# Patient Record
Sex: Male | Born: 1960 | Race: Black or African American | Hispanic: No | Marital: Married | Smoking: Never smoker
Health system: Southern US, Community
[De-identification: ages and names within clinical notes are randomized; demographics above are authoritative.]

## PROBLEM LIST (undated history)

## (undated) DIAGNOSIS — E785 Hyperlipidemia, unspecified: Secondary | ICD-10-CM

## (undated) DIAGNOSIS — M109 Gout, unspecified: Secondary | ICD-10-CM

## (undated) DIAGNOSIS — I10 Essential (primary) hypertension: Secondary | ICD-10-CM

## (undated) HISTORY — DX: Hyperlipidemia, unspecified: E78.5

---

## 2001-04-15 ENCOUNTER — Encounter: Payer: Self-pay | Admitting: Emergency Medicine

## 2001-04-15 ENCOUNTER — Emergency Department (HOSPITAL_COMMUNITY): Admission: EM | Admit: 2001-04-15 | Discharge: 2001-04-15 | Payer: Self-pay | Admitting: Emergency Medicine

## 2001-04-16 ENCOUNTER — Emergency Department (HOSPITAL_COMMUNITY): Admission: EM | Admit: 2001-04-16 | Discharge: 2001-04-16 | Payer: Self-pay | Admitting: Emergency Medicine

## 2003-06-24 ENCOUNTER — Emergency Department (HOSPITAL_COMMUNITY): Admission: EM | Admit: 2003-06-24 | Discharge: 2003-06-24 | Payer: Self-pay

## 2004-02-14 ENCOUNTER — Emergency Department (HOSPITAL_COMMUNITY): Admission: EM | Admit: 2004-02-14 | Discharge: 2004-02-14 | Payer: Self-pay | Admitting: Family Medicine

## 2004-05-05 ENCOUNTER — Emergency Department (HOSPITAL_COMMUNITY): Admission: EM | Admit: 2004-05-05 | Discharge: 2004-05-05 | Payer: Self-pay | Admitting: Emergency Medicine

## 2008-06-09 ENCOUNTER — Encounter: Payer: Self-pay | Admitting: Internal Medicine

## 2008-06-09 ENCOUNTER — Ambulatory Visit: Payer: Self-pay | Admitting: Internal Medicine

## 2008-06-09 DIAGNOSIS — M79609 Pain in unspecified limb: Secondary | ICD-10-CM

## 2008-06-09 DIAGNOSIS — I10 Essential (primary) hypertension: Secondary | ICD-10-CM | POA: Insufficient documentation

## 2008-06-09 DIAGNOSIS — L309 Dermatitis, unspecified: Secondary | ICD-10-CM

## 2008-06-09 DIAGNOSIS — L239 Allergic contact dermatitis, unspecified cause: Secondary | ICD-10-CM | POA: Insufficient documentation

## 2008-06-09 DIAGNOSIS — F528 Other sexual dysfunction not due to a substance or known physiological condition: Secondary | ICD-10-CM

## 2008-06-11 DIAGNOSIS — E291 Testicular hypofunction: Secondary | ICD-10-CM

## 2008-06-11 LAB — CONVERTED CEMR LAB
ALT: 23 units/L (ref 0–53)
AST: 20 units/L (ref 0–37)
Albumin: 4.3 g/dL (ref 3.5–5.2)
Alkaline Phosphatase: 56 units/L (ref 39–117)
BUN: 25 mg/dL — ABNORMAL HIGH (ref 6–23)
Basophils Absolute: 0 10*3/uL (ref 0.0–0.1)
Basophils Relative: 0 % (ref 0–1)
CO2: 23 meq/L (ref 19–32)
Calcium: 9.2 mg/dL (ref 8.4–10.5)
Chloride: 104 meq/L (ref 96–112)
Creatinine, Ser: 1.15 mg/dL (ref 0.40–1.50)
Eosinophils Absolute: 0.3 10*3/uL (ref 0.0–0.7)
Eosinophils Relative: 5 % (ref 0–5)
Glucose, Bld: 106 mg/dL — ABNORMAL HIGH (ref 70–99)
HCT: 41.6 % (ref 39.0–52.0)
Hemoglobin: 13.5 g/dL (ref 13.0–17.0)
Lymphocytes Relative: 37 % (ref 12–46)
Lymphs Abs: 2.1 10*3/uL (ref 0.7–4.0)
MCHC: 32.5 g/dL (ref 30.0–36.0)
MCV: 84.6 fL (ref 78.0–100.0)
Monocytes Absolute: 0.5 10*3/uL (ref 0.1–1.0)
Monocytes Relative: 9 % (ref 3–12)
Neutro Abs: 2.8 10*3/uL (ref 1.7–7.7)
Neutrophils Relative %: 48 % (ref 43–77)
PSA: 1.04 ng/mL (ref 0.10–4.00)
Platelets: 210 10*3/uL (ref 150–400)
Potassium: 4.3 meq/L (ref 3.5–5.3)
RBC: 4.92 M/uL (ref 4.22–5.81)
RDW: 13.7 % (ref 11.5–15.5)
Sodium: 138 meq/L (ref 135–145)
TSH: 0.935 microintl units/mL (ref 0.350–4.50)
Testosterone: 220.43 ng/dL — ABNORMAL LOW (ref 350–890)
Total Bilirubin: 1 mg/dL (ref 0.3–1.2)
Total Protein: 7.5 g/dL (ref 6.0–8.3)
WBC: 5.7 10*3/uL (ref 4.0–10.5)

## 2008-06-16 ENCOUNTER — Telehealth: Payer: Self-pay | Admitting: Internal Medicine

## 2008-06-16 ENCOUNTER — Encounter: Payer: Self-pay | Admitting: Internal Medicine

## 2008-07-07 ENCOUNTER — Telehealth: Payer: Self-pay | Admitting: Internal Medicine

## 2008-08-08 ENCOUNTER — Telehealth: Payer: Self-pay | Admitting: Internal Medicine

## 2008-08-10 ENCOUNTER — Telehealth: Payer: Self-pay | Admitting: *Deleted

## 2008-12-29 ENCOUNTER — Telehealth: Payer: Self-pay | Admitting: Internal Medicine

## 2009-01-02 ENCOUNTER — Ambulatory Visit: Payer: Self-pay | Admitting: Infectious Disease

## 2009-01-02 LAB — CONVERTED CEMR LAB
BUN: 20 mg/dL (ref 6–23)
CO2: 22 meq/L (ref 19–32)
Calcium: 9.1 mg/dL (ref 8.4–10.5)
Chloride: 105 meq/L (ref 96–112)
Cholesterol: 215 mg/dL — ABNORMAL HIGH (ref 0–200)
Creatinine, Ser: 1.01 mg/dL (ref 0.40–1.50)
Glucose, Bld: 94 mg/dL (ref 70–99)
HDL: 54 mg/dL (ref >39)
LDL Cholesterol: 98 mg/dL (ref 0–99)
Potassium: 4.4 meq/L (ref 3.5–5.3)
Sodium: 140 meq/L (ref 135–145)
Total CHOL/HDL Ratio: 4
Triglycerides: 316 mg/dL — ABNORMAL HIGH (ref <150)
VLDL: 63 mg/dL — ABNORMAL HIGH (ref 0–40)

## 2010-07-30 ENCOUNTER — Emergency Department (HOSPITAL_COMMUNITY)
Admission: EM | Admit: 2010-07-30 | Discharge: 2010-07-30 | Disposition: A | Discharge status: Home / Self Care | Payer: Self-pay | Attending: Emergency Medicine | Admitting: Emergency Medicine

## 2010-07-30 DIAGNOSIS — M25429 Effusion, unspecified elbow: Secondary | ICD-10-CM | POA: Insufficient documentation

## 2010-07-30 DIAGNOSIS — M25529 Pain in unspecified elbow: Secondary | ICD-10-CM | POA: Insufficient documentation

## 2010-07-30 DIAGNOSIS — M109 Gout, unspecified: Secondary | ICD-10-CM | POA: Insufficient documentation

## 2010-07-30 LAB — SYNOVIAL CELL COUNT + DIFF, W/ CRYSTALS
Lymphocytes-Synovial Fld: 1 % (ref 0–20)
Monocyte-Macrophage-Synovial Fluid: 5 % — ABNORMAL LOW (ref 50–90)
Neutrophil, Synovial: 94 % — ABNORMAL HIGH (ref 0–25)
WBC, Synovial: 82000 /mm3 — ABNORMAL HIGH (ref 0–200)

## 2010-08-03 LAB — BODY FLUID CULTURE: Culture: NO GROWTH

## 2010-10-02 ENCOUNTER — Emergency Department (HOSPITAL_COMMUNITY): Payer: No Typology Code available for payment source

## 2010-10-02 ENCOUNTER — Emergency Department (HOSPITAL_COMMUNITY)
Admission: EM | Admit: 2010-10-02 | Discharge: 2010-10-02 | Disposition: A | Discharge status: Home / Self Care | Payer: No Typology Code available for payment source | Attending: Emergency Medicine | Admitting: Emergency Medicine

## 2010-10-02 ENCOUNTER — Encounter (HOSPITAL_COMMUNITY): Payer: Self-pay | Admitting: Radiology

## 2010-10-02 DIAGNOSIS — R071 Chest pain on breathing: Secondary | ICD-10-CM | POA: Insufficient documentation

## 2010-10-02 DIAGNOSIS — Z862 Personal history of diseases of the blood and blood-forming organs and certain disorders involving the immune mechanism: Secondary | ICD-10-CM | POA: Insufficient documentation

## 2010-10-02 DIAGNOSIS — S298XXA Other specified injuries of thorax, initial encounter: Secondary | ICD-10-CM | POA: Insufficient documentation

## 2010-10-02 DIAGNOSIS — IMO0002 Reserved for concepts with insufficient information to code with codable children: Secondary | ICD-10-CM | POA: Insufficient documentation

## 2010-10-02 DIAGNOSIS — R10811 Right upper quadrant abdominal tenderness: Secondary | ICD-10-CM | POA: Insufficient documentation

## 2010-10-02 DIAGNOSIS — Z8639 Personal history of other endocrine, nutritional and metabolic disease: Secondary | ICD-10-CM | POA: Insufficient documentation

## 2010-10-02 HISTORY — DX: Essential (primary) hypertension: I10

## 2010-10-02 LAB — POCT I-STAT, CHEM 8
Glucose, Bld: 107 mg/dL — ABNORMAL HIGH (ref 70–99)
HCT: 45 % (ref 39.0–52.0)
Hemoglobin: 15.3 g/dL (ref 13.0–17.0)
Potassium: 4.4 mEq/L (ref 3.5–5.1)

## 2010-10-02 MED ORDER — IOHEXOL 300 MG/ML  SOLN
100.0000 mL | Freq: Once | INTRAMUSCULAR | Status: AC | PRN
  Administered 2010-10-02: 100 mL via INTRAVENOUS

## 2010-10-07 ENCOUNTER — Emergency Department (HOSPITAL_COMMUNITY)
Admission: EM | Admit: 2010-10-07 | Discharge: 2010-10-07 | Disposition: A | Discharge status: Home / Self Care | Payer: No Typology Code available for payment source | Attending: Emergency Medicine | Admitting: Emergency Medicine

## 2010-10-07 ENCOUNTER — Emergency Department (HOSPITAL_COMMUNITY): Payer: No Typology Code available for payment source

## 2010-10-07 DIAGNOSIS — R079 Chest pain, unspecified: Secondary | ICD-10-CM | POA: Insufficient documentation

## 2010-10-07 DIAGNOSIS — S2239XA Fracture of one rib, unspecified side, initial encounter for closed fracture: Secondary | ICD-10-CM | POA: Insufficient documentation

## 2010-10-07 DIAGNOSIS — R030 Elevated blood-pressure reading, without diagnosis of hypertension: Secondary | ICD-10-CM | POA: Insufficient documentation

## 2010-10-07 DIAGNOSIS — Z862 Personal history of diseases of the blood and blood-forming organs and certain disorders involving the immune mechanism: Secondary | ICD-10-CM | POA: Insufficient documentation

## 2010-10-07 DIAGNOSIS — Z8639 Personal history of other endocrine, nutritional and metabolic disease: Secondary | ICD-10-CM | POA: Insufficient documentation

## 2010-10-09 ENCOUNTER — Ambulatory Visit (INDEPENDENT_AMBULATORY_CARE_PROVIDER_SITE_OTHER): Payer: No Typology Code available for payment source | Admitting: Internal Medicine

## 2010-10-09 ENCOUNTER — Encounter: Payer: Self-pay | Admitting: Internal Medicine

## 2010-10-09 VITALS — BP 158/92 | HR 56 | Temp 97.0°F | Ht 72.0 in | Wt 218.6 lb

## 2010-10-09 DIAGNOSIS — S2239XA Fracture of one rib, unspecified side, initial encounter for closed fracture: Secondary | ICD-10-CM

## 2010-10-09 DIAGNOSIS — S2249XA Multiple fractures of ribs, unspecified side, initial encounter for closed fracture: Secondary | ICD-10-CM | POA: Insufficient documentation

## 2010-10-09 MED ORDER — OXYCODONE-ACETAMINOPHEN 7.5-500 MG PO TABS: 1.0000 | ORAL_TABLET | Freq: Four times a day (QID) | ORAL | Status: DC | PRN

## 2010-10-09 NOTE — Patient Instructions (Addendum)
Schedule a follow up appointment with your primary care provider for an annual physical. If your pain becomes worse, or you develop any fevers, chills, cough, other concerning symptoms, clinic at [phone] to schedule an appointment. Usual Percocet as directed.  Avoid taking any additional Tylenol while you take Percocet. Continue to use over-the-counter naproxen.  You may take a maximum of 500 mg up to 3 times a day.  Do not exceed 1500 mg per 24 hours.   Use your incentive spirometer to help prevent pneumonia.  Trying to use it 10 times per hour while you are awake.  Rib Fracture Your caregiver has diagnosed you as having a rib fracture (a break). This can occur by a blow to the chest, by a fall against a hard object, or by violent coughing or sneezing. There may be one or many breaks. Rib fractures may heal on their own within 3 to 8 weeks. The longer healing period is usually associated with a continued cough or other aggravating activities. HOME CARE INSTRUCTIONS  Avoid strenuous activity. Be careful during activities and avoid bumping the injured rib. Activities that cause pain pull on the fracture site(s) and are best avoided if possible.   Eat a normal, well-balanced diet. Drink plenty of fluids to avoid constipation.   Take deep breaths several times a day to keep lungs free of infection. Try to cough several times a day, splinting the injured area with a pillow. This will help prevent pneumonia.   Do not wear a rib belt or binder. These restrict breathing which can lead to pneumonia.   Only take over-the-counter or prescription medicines for pain, discomfort, or fever as directed by your caregiver.  SEEK MEDICAL CARE IF:  An oral temperature above 101 develops, not controlled by medication.   You develop a continual cough, associated with thick or bloody sputum.  SEEK IMMEDIATE MEDICAL CARE IF:  You have difficulty breathing.   You have nausea (feeling sick to your stomach),  vomiting, or abdominal (belly) pain.   You have worsening pain, not controlled with medications.  Document Released: 03/31/2005 Document Re-Released: 09/17/2007 [facility] Patient Information 2011 [place], [place].
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
## 2010-10-09 NOTE — Progress Notes (Signed)
Rib binder obtained for pt - chest 44" per Dr Arvilla Market. Stanton Kidney Lahoma Constantin RN  10/09/10 11:30AM

## 2010-10-11 ENCOUNTER — Emergency Department (HOSPITAL_COMMUNITY)
Admission: EM | Admit: 2010-10-11 | Discharge: 2010-10-11 | Disposition: A | Discharge status: Home / Self Care | Payer: No Typology Code available for payment source | Attending: Emergency Medicine | Admitting: Emergency Medicine

## 2010-10-11 ENCOUNTER — Emergency Department (HOSPITAL_COMMUNITY): Payer: No Typology Code available for payment source

## 2010-10-11 DIAGNOSIS — R05 Cough: Secondary | ICD-10-CM | POA: Insufficient documentation

## 2010-10-11 DIAGNOSIS — R059 Cough, unspecified: Secondary | ICD-10-CM | POA: Insufficient documentation

## 2010-10-11 DIAGNOSIS — R0789 Other chest pain: Secondary | ICD-10-CM | POA: Insufficient documentation

## 2010-10-14 ENCOUNTER — Telehealth: Payer: Self-pay | Admitting: *Deleted

## 2010-10-14 ENCOUNTER — Ambulatory Visit (INDEPENDENT_AMBULATORY_CARE_PROVIDER_SITE_OTHER): Payer: No Typology Code available for payment source | Admitting: Internal Medicine

## 2010-10-14 ENCOUNTER — Encounter: Payer: Self-pay | Admitting: Internal Medicine

## 2010-10-14 DIAGNOSIS — G4700 Insomnia, unspecified: Secondary | ICD-10-CM

## 2010-10-14 DIAGNOSIS — S2239XA Fracture of one rib, unspecified side, initial encounter for closed fracture: Secondary | ICD-10-CM

## 2010-10-14 DIAGNOSIS — I10 Essential (primary) hypertension: Secondary | ICD-10-CM

## 2010-10-14 MED ORDER — TRAZODONE HCL 100 MG PO TABS: 100.0000 mg | ORAL_TABLET | Freq: Every day | ORAL | Status: DC

## 2010-10-14 MED ORDER — HYDROCODONE-ACETAMINOPHEN 7.5-750 MG PO TABS: 1.0000 | ORAL_TABLET | Freq: Four times a day (QID) | ORAL | Status: DC | PRN
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
## 2010-10-14 NOTE — Assessment & Plan Note (Signed)
He reports only sleeping 1-2 hours per night because of his rib pain. -Will prescribe short course of Trazadone 100mg  po qhs

## 2010-10-14 NOTE — Assessment & Plan Note (Signed)
Patient appears comfortable today and is not in any acute distress.  Still c/o pain on right ribs.  I reviewed CXR on 10/11/10: no acute abnormalities, no pneumothorax or pleural effusion. -Will prescribe short course of Vicodin 7.5mg  since Percocet made him nauseous. -Continue Naprosyn to alleviate pain and inflammation -Encourage him to continue using incentive broader -Patient will have f/u appointment with Dr. Everlena Cooper on 10/17/10 -If pain is worsen, I instructed him to go to ED for further evaluation

## 2010-10-14 NOTE — Patient Instructions (Addendum)
Please make sure you follow up with Dr. Everlena Cooper on 10/17/10 Take Vicodin every 6 hours as needed for pain Continue taking Naprosyn to reduce pain and inflammation Take Trazadone 100mg  at night 30 mins to 1 hours before bedtime as needed for sleep/insomnia Continue using incentive broader  Follow up in 1 month

## 2010-10-14 NOTE — Assessment & Plan Note (Signed)
Likely 2/2 to pain.  Will continue to monitor for now.

## 2010-10-14 NOTE — Telephone Encounter (Signed)
Patient seen in clinic this AM
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
## 2010-10-25 ENCOUNTER — Encounter: Payer: No Typology Code available for payment source | Admitting: Internal Medicine

## 2010-10-31 ENCOUNTER — Other Ambulatory Visit: Payer: Self-pay | Admitting: *Deleted

## 2010-11-04 NOTE — Telephone Encounter (Signed)
Not intended for further refills.  Patient canceled 2 appointments.  Will have to be seen before any refills.

## 2010-11-08 ENCOUNTER — Telehealth: Payer: Self-pay | Admitting: *Deleted
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
## 2010-11-08 NOTE — Telephone Encounter (Signed)
His PCP can determine the return date to work at the next office visit.  For now, you can put 11/19/10 for the return date.

## 2010-11-11 ENCOUNTER — Encounter: Payer: Self-pay | Admitting: Internal Medicine

## 2010-11-11 ENCOUNTER — Ambulatory Visit (HOSPITAL_COMMUNITY)
Admission: RE | Admit: 2010-11-11 | Discharge: 2010-11-11 | Disposition: A | Discharge status: Home / Self Care | Payer: No Typology Code available for payment source | Source: Ambulatory Visit | Attending: Internal Medicine | Admitting: Internal Medicine

## 2010-11-11 ENCOUNTER — Encounter: Payer: No Typology Code available for payment source | Admitting: Internal Medicine

## 2010-11-11 ENCOUNTER — Ambulatory Visit (INDEPENDENT_AMBULATORY_CARE_PROVIDER_SITE_OTHER): Payer: No Typology Code available for payment source | Admitting: Internal Medicine

## 2010-11-11 DIAGNOSIS — S2239XA Fracture of one rib, unspecified side, initial encounter for closed fracture: Secondary | ICD-10-CM

## 2010-11-11 DIAGNOSIS — R0789 Other chest pain: Secondary | ICD-10-CM | POA: Insufficient documentation

## 2010-11-11 DIAGNOSIS — I10 Essential (primary) hypertension: Secondary | ICD-10-CM

## 2010-11-11 MED ORDER — NAPROXEN 500 MG PO TABS: 500.0000 mg | ORAL_TABLET | Freq: Two times a day (BID) | ORAL | Status: DC

## 2010-11-11 NOTE — Patient Instructions (Signed)
Rib Fracture °Your caregiver has diagnosed you as having a rib fracture (a break). This can occur by a blow to the chest, by a fall against a hard object, or by violent coughing or sneezing. There may be one or many breaks. Rib fractures may heal on their own within 3 to 8 weeks. The longer healing period is usually associated with a continued cough or other aggravating activities. °HOME CARE INSTRUCTIONS °· Avoid strenuous activity. Be careful during activities and avoid bumping the injured rib. Activities that cause pain pull on the fracture site(s) and are best avoided if possible.  °· Eat a normal, well-balanced diet. Drink plenty of fluids to avoid constipation.  °· Take deep breaths several times a day to keep lungs free of infection. Try to cough several times a day, splinting the injured area with a pillow. This will help prevent pneumonia.  °· Do not wear a rib belt or binder. These restrict breathing which can lead to pneumonia.  °· Only take over-the-counter or prescription medicines for pain, discomfort, or fever as directed by your caregiver.  °SEEK MEDICAL CARE IF: °· An oral temperature above 101 develops, not controlled by medication.  °· You develop a continual cough, associated with thick or bloody sputum.  °SEEK IMMEDIATE MEDICAL CARE IF: °· You have difficulty breathing.  °· You have nausea (feeling sick to your stomach), vomiting, or abdominal (belly) pain.  °· You have worsening pain, not controlled with medications.  °Document Released: 03/31/2005 Document Re-Released: 09/17/2007 °[employer]® Patient Information ©2011 [employer]. °

## 2010-11-11 NOTE — Assessment & Plan Note (Signed)
The patient has been recovering well. States that pain has decreased tremendously and he has to use only 2 tablets of the proximity to control the pain. Has no breathing difficulty. I would obtain a chest x-ray today she should be able to join back work.

## 2010-11-11 NOTE — Progress Notes (Signed)
Addended by: Maura Crandall on: 11/11/2010 03:41 PM   Modules accepted: Orders

## 2010-11-11 NOTE — Telephone Encounter (Signed)
Pt seen in office today by Dr Eben Burow.  Pt states he's is going back to work on 12/09/10.  Per Dr Eben Burow, it's ok for patient to rerturn to work on 8/27 without restrictions.  Contacted Arna Medici and informed her.Criss Alvine, Yesennia Hirota Cassady7/30/20123:50 PM
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
## 2010-11-11 NOTE — Assessment & Plan Note (Signed)
Difficult to manage especially in the setting of acute pain and use of NSAIDs. Patient should come back in 2-3 months to review blood pressure medications.

## 2010-11-15 ENCOUNTER — Other Ambulatory Visit: Payer: Self-pay | Admitting: *Deleted

## 2010-11-15 NOTE — Telephone Encounter (Signed)
RTC from and to pt said that his pain is not  completely relived by the Naproxen only last for a little while.  When asked said that his pain level was 8.5. And it comes and goes at that level.  Said that the pain is on the right side.

## 2010-11-15 NOTE — Telephone Encounter (Signed)
Pt called said that he would like to get a refill said that the Vicodin does not work.

## 2010-11-15 NOTE — Telephone Encounter (Signed)
Tyler Burch,  This all sounds very suspicious as I just saw him earlier this week and he was pretty sure that his pain is very well relieved with NSAIDS over the counter, he specifically told me that he was out of vicodin for a while now and he anyways didn't use them a lot as it made him sleepy. I am wondering if he said that vicodin was making him sleepy, how he is going to handle percocet 7.5? He also asked me to fill a disability form saying that he should be okay to join back work by the end of this month as he is feeling great and ready to join back. If he is having a sudden exacerbation of pain and needs to refill his narcotic prescription, I would have him seen by a clinic physician before doing so.  Thanks, Makael Stein

## 2010-11-15 NOTE — Telephone Encounter (Signed)
Call to pt to inform him that his Percocet refill has been denied per Dr. Eben Burow.  Message left on pt's answering machine that if his pain has increased and he needs narcotics he will need to be seem.  Pt will need to call for an appointment to assess his pain.

## 2010-11-15 NOTE — Telephone Encounter (Signed)
Call to pt to inform him of the denial.  Line busy.

## 2011-05-26 ENCOUNTER — Encounter: Payer: No Typology Code available for payment source | Admitting: Internal Medicine

## 2011-09-05 ENCOUNTER — Emergency Department (HOSPITAL_COMMUNITY)
Admission: EM | Admit: 2011-09-05 | Discharge: 2011-09-06 | Disposition: A | Discharge status: Home / Self Care | Payer: BC Managed Care – PPO | Attending: Emergency Medicine | Admitting: Emergency Medicine

## 2011-09-05 ENCOUNTER — Encounter (HOSPITAL_COMMUNITY): Payer: Self-pay | Admitting: *Deleted

## 2011-09-05 DIAGNOSIS — S8990XA Unspecified injury of unspecified lower leg, initial encounter: Secondary | ICD-10-CM | POA: Insufficient documentation

## 2011-09-05 DIAGNOSIS — Y9269 Other specified industrial and construction area as the place of occurrence of the external cause: Secondary | ICD-10-CM | POA: Insufficient documentation

## 2011-09-05 DIAGNOSIS — IMO0002 Reserved for concepts with insufficient information to code with codable children: Secondary | ICD-10-CM | POA: Insufficient documentation

## 2011-09-05 DIAGNOSIS — L02419 Cutaneous abscess of limb, unspecified: Secondary | ICD-10-CM | POA: Insufficient documentation

## 2011-09-05 DIAGNOSIS — L03115 Cellulitis of right lower limb: Secondary | ICD-10-CM

## 2011-09-05 DIAGNOSIS — I10 Essential (primary) hypertension: Secondary | ICD-10-CM | POA: Insufficient documentation

## 2011-09-05 NOTE — ED Notes (Signed)
The pt bumped his knee at work 3 days ago no noticeable skin break

## 2011-09-05 NOTE — ED Notes (Signed)
The pt has a red swollen rt knee with swelling to the tib-fib area

## 2011-09-06 ENCOUNTER — Emergency Department (HOSPITAL_COMMUNITY): Payer: BC Managed Care – PPO

## 2011-09-06 MED ORDER — HYDROCODONE-ACETAMINOPHEN 5-325 MG PO TABS
1.0000 | ORAL_TABLET | Freq: Once | ORAL | Status: AC
  Administered 2011-09-06: 1 via ORAL
  Filled 2011-09-06: qty 1

## 2011-09-06 MED ORDER — AMOXICILLIN-POT CLAVULANATE 875-125 MG PO TABS
1.0000 | ORAL_TABLET | Freq: Once | ORAL | Status: AC
  Administered 2011-09-06: 1 via ORAL
  Filled 2011-09-06: qty 1

## 2011-09-06 MED ORDER — AMOXICILLIN-POT CLAVULANATE 875-125 MG PO TABS: 1.0000 | ORAL_TABLET | Freq: Two times a day (BID) | ORAL | Status: AC

## 2011-09-06 MED ORDER — HYDROCODONE-ACETAMINOPHEN 5-325 MG PO TABS: 1.0000 | ORAL_TABLET | ORAL | Status: AC | PRN

## 2011-09-06 NOTE — ED Provider Notes (Signed)
Medical screening examination/treatment/procedure(s) were performed by non-physician practitioner and as supervising physician I was immediately available for consultation/collaboration.   Shaliyah Taite, MD 09/06/11 0848 

## 2011-09-06 NOTE — ED Notes (Signed)
Pt hit his R knee against the corner of a machine at his job 3 days ago.  Though he has been taking naproxen and icing/elevating it, the pain and swelling has been getting worse.  Pt denies hx of R knee injury.  R knee with swelling, redness and warmth.

## 2011-09-06 NOTE — ED Provider Notes (Signed)
History     CSN: [id number]  Arrival date & time 09/05/11  2332   First MD Initiated Contact with Patient 09/06/11 0114      Chief Complaint  Patient presents with  . Knee Injury    (Consider location/radiation/quality/duration/timing/severity/associated sxs/prior treatment) HPI Comments: Patient here with and swelling in the right knee - he states that he struck the knee against a piece of machinery at work 3 days ago - states that has gradually increased in pain and swelling - states that tonight he noticed the redness to the knee - no known break in the skin prior to this - denies fever, chills, reports pain with ambulation and flexion of the knee - denies any history of STD, dysuria, uretheral discharge.  No prior injury to the knee.  Patient is a 51 y.o. male presenting with knee pain. The history is provided by the patient. No language interpreter was used.  Knee Pain This is a new problem. The current episode started in the past 7 days. The problem occurs constantly. The problem has been unchanged. Associated symptoms include arthralgias, joint swelling and myalgias. Pertinent negatives include no abdominal pain, anorexia, change in bowel habit, chest pain, chills, congestion, coughing, diaphoresis, fatigue, fever, headaches, nausea, neck pain, numbness, rash, sore throat, swollen glands, urinary symptoms, vertigo, visual change, vomiting or weakness. The symptoms are aggravated by bending. He has tried nothing for the symptoms. The treatment provided no relief.    Past Medical History  Diagnosis Date  . Hypertension     History reviewed. No pertinent past surgical history.  No family history on file.  History  Substance Use Topics  . Smoking status: Never Smoker   . Smokeless tobacco: Not on file  . Alcohol Use: Yes      Review of Systems  Constitutional: Negative for fever, chills, diaphoresis and fatigue.  HENT: Negative for congestion, sore throat and neck pain.     Respiratory: Negative for cough.   Cardiovascular: Negative for chest pain.  Gastrointestinal: Negative for nausea, vomiting, abdominal pain, anorexia and change in bowel habit.  Musculoskeletal: Positive for myalgias, joint swelling and arthralgias.  Skin: Negative for rash.  Neurological: Negative for vertigo, weakness, numbness and headaches.  All other systems reviewed and are negative.    Allergies  Review of patient's allergies indicates no known allergies.  Home Medications   Current Outpatient Rx  Name Route Sig Dispense Refill  . NAPROXEN SODIUM 220 MG PO CAPS Oral Take 440 mg by mouth 2 (two) times daily as needed. For pain      BP 146/86  Pulse 88  Temp 98.3 F (36.8 C)  Resp 16  SpO2 99%  Physical Exam  Nursing note and vitals reviewed. Constitutional: He is oriented to person, place, and time. He appears well-developed and well-nourished. No distress.  HENT:  Head: Normocephalic and atraumatic.  Right Ear: External ear normal.  Left Ear: External ear normal.  Nose: Nose normal.  Mouth/Throat: Oropharynx is clear and moist. No oropharyngeal exudate.  Eyes: Conjunctivae are normal. Pupils are equal, round, and reactive to light. No scleral icterus.  Neck: Normal range of motion. Neck supple.  Cardiovascular: Normal rate, regular rhythm and normal heart sounds.  Exam reveals no gallop and no friction rub.   No murmur heard. Pulmonary/Chest: Effort normal and breath sounds normal. No respiratory distress. He has no wheezes. He has no rales. He exhibits no tenderness.  Abdominal: Soft. Bowel sounds are normal. He exhibits no distension. There  is no tenderness.  Musculoskeletal:       Right knee: He exhibits decreased range of motion, swelling and erythema. He exhibits no effusion, no deformity, no laceration, no LCL laxity, normal patellar mobility, normal meniscus and no MCL laxity. tenderness found. Patellar tendon tenderness noted.        Legs: Lymphadenopathy:    He has no cervical adenopathy.  Neurological: He is alert and oriented to person, place, and time. No cranial nerve deficit.  Skin: Skin is warm and dry. No rash noted. There is erythema. No pallor.  Psychiatric: He has a normal mood and affect. His behavior is normal. Judgment and thought content normal.    ED Course  Procedures (including critical care time)  Labs Reviewed - No data to display Dg Knee 2 Views Right  09/06/2011  *RADIOLOGY REPORT*  Clinical Data: Right knee pain/injury  RIGHT KNEE - 1-2 VIEW  Comparison: None.  Findings: No fracture or dislocation is seen.  The joint spaces are preserved.  Mild prepatellar soft tissue swelling.  No definite suprapatellar knee joint effusion.  IMPRESSION: No fracture or dislocation is seen.  Mild prepatellar soft tissue swelling.  Original Report Authenticated By: Charline Bills, M.D.     Soft tissue infection to right knee    MDM  Patient with history of mechanical injury to the right knee presents tonight with progressive swelling and redness to the knee, x-ray does not suggest abnormality to the joint itself, swelling seems to be more suprapatellar in nature so I suspect this to be more related to a cellulitis.  I will place the patient on abx and give referral to Dr. Lajoyce Corners in orthopedics for follow up.        Izola Price [place], [place] 09/06/11 [phone]

## 2011-09-06 NOTE — ED Notes (Signed)
Ortho tech called regarding knee immobilizer and crutches order

## 2011-09-06 NOTE — Discharge Instructions (Signed)
Cellulitis Cellulitis is an infection of the skin and the tissue beneath it. The area is typically red and tender. It is caused by germs (bacteria) (usually staph or strep) that enter the body through cuts or sores. Cellulitis most commonly occurs in the arms or lower legs.  HOME CARE INSTRUCTIONS   If you are given a prescription for medications which kill germs (antibiotics), take as directed until finished.   If the infection is on the arm or leg, keep the limb elevated as able.   Use a warm cloth several times per day to relieve pain and encourage healing.   See your caregiver for recheck of the infected site as directed if problems arise.   Only take over-the-counter or prescription medicines for pain, discomfort, or fever as directed by your caregiver.  SEEK MEDICAL CARE IF:   The area of redness (inflammation) is spreading, there are red streaks coming from the infected site, or if a part of the infection begins to turn dark in color.   The joint or bone underneath the infected skin becomes painful after the skin has healed.   The infection returns in the same or another area after it seems to have gone away.   A boil or bump swells up. This may be an abscess.   New, unexplained problems such as pain or fever develop.  SEEK IMMEDIATE MEDICAL CARE IF:   You have a fever.   You or your child feels drowsy or lethargic.   There is vomiting, diarrhea, or lasting discomfort or feeling ill (malaise) with muscle aches and pains.  MAKE SURE YOU:   Understand these instructions.   Will watch your condition.   Will get help right away if you are not doing well or get worse.  Document Released: 01/08/2005 Document Revised: 03/20/2011 Document Reviewed: 11/17/2007 Exit[employer] Patient Information 2012 [employer].Skin Infections A skin infection usually develops as a result of disruption of the skin barrier.  CAUSES  A skin infection might occur following:  Trauma or an injury  to the skin such as a cut or insect sting.   Inflammation (as in eczema).   Breaks in the skin between the toes (as in athlete's foot).   Swelling (edema).  SYMPTOMS  The legs are the most common site affected. Usually there is:  Redness.   Swelling.   Pain.   There may be red streaks in the area of the infection.  TREATMENT   Minor skin infections may be treated with topical antibiotics, but if the skin infection is severe, hospital care and intravenous (IV) antibiotic treatment may be needed.   Most often skin infections can be treated with oral antibiotic medicine as well as proper rest and elevation of the affected area until the infection improves.   If you are prescribed oral antibiotics, it is important to take them as directed and to take all the pills even if you feel better before you have finished all of the medicine.   You may apply warm compresses to the area for 20-30 minutes 4 times daily.  You might need a tetanus shot now if:  You have no idea when you had the last one.   You have never had a tetanus shot before.   Your wound had dirt in it.  If you need a tetanus shot and you decide not to get one, there is a rare chance of getting tetanus. Sickness from tetanus can be serious. If you get a tetanus shot, your   arm may swell and become red and warm at the shot site. This is common and not a problem. SEEK MEDICAL CARE IF:  The pain and swelling from your infection do not improve within 2 days.  SEEK IMMEDIATE MEDICAL CARE IF:  You develop a fever, chills, or other serious problems.  Document Released: 05/08/2004 Document Revised: 03/20/2011 Document Reviewed: 03/20/2008 [employer] Patient Information 2012 [employer]. 

## 2011-09-06 NOTE — ED Notes (Signed)
Patient transported to X-ray 

## 2011-12-11 ENCOUNTER — Encounter: Payer: BC Managed Care – PPO | Admitting: Internal Medicine

## 2011-12-11 ENCOUNTER — Encounter: Payer: Self-pay | Admitting: Internal Medicine

## 2011-12-11 ENCOUNTER — Ambulatory Visit (INDEPENDENT_AMBULATORY_CARE_PROVIDER_SITE_OTHER): Payer: BC Managed Care – PPO | Admitting: Internal Medicine

## 2011-12-11 VITALS — BP 155/93 | HR 56 | Temp 97.1°F | Ht 72.0 in | Wt 205.6 lb

## 2011-12-11 DIAGNOSIS — Z23 Encounter for immunization: Secondary | ICD-10-CM

## 2011-12-11 MED ORDER — TRIAMCINOLONE ACETONIDE 0.025 % EX OINT: TOPICAL_OINTMENT | Freq: Two times a day (BID) | CUTANEOUS | Status: DC

## 2011-12-11 NOTE — Patient Instructions (Signed)
You were seen for a check up and we gave you a tetanus shot and refilled your cream for eczema. We would like you to stretch your feet every day to work on the pain. Come back in 3 months or sooner if you have problems. Our number is [phone].

## 2011-12-12 NOTE — Progress Notes (Signed)
Subjective:     Patient ID: Tyler Burch, male   DOB: 03-Feb-1961, 51 y.o.   MRN: [id number]  HPI The patient is a 51 year old male who comes in today for a followup visit for some pain in his feet. 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 He states that his pain is much worse at nighttime or after he's been on his feet for an extended period of time. The only medications he takes at home is Aleve for his foot pain which generally resolves the pain and triamcinolone cream for some eczema. He is not having any other complaints at today's visit.   Review of Systems  Constitutional: Negative for fever, chills, diaphoresis, activity change, appetite change, fatigue and unexpected weight change.  HENT: Negative.   Eyes: Negative.   Respiratory: Negative for cough, chest tightness, shortness of breath and wheezing.   Cardiovascular: Negative for chest pain, palpitations and leg swelling.  Gastrointestinal: Negative for nausea, vomiting, abdominal pain, diarrhea and abdominal distention.  Musculoskeletal: Positive for myalgias. Negative for back pain, joint swelling, arthralgias and gait problem.  Skin: Negative.   Neurological: Negative.   Hematological: Negative.   Psychiatric/Behavioral: Negative.        Objective:   Physical Exam  Constitutional: He is oriented to person, place, and time. He appears well-developed and well-nourished. No distress.  HENT:  Head: Normocephalic and atraumatic.  Eyes: EOM are normal. Pupils are equal, round, and reactive to light.  Neck: Normal range of motion. Neck supple.  Cardiovascular: Normal rate and regular rhythm.   No murmur heard. Pulmonary/Chest: Effort normal and breath sounds normal. No respiratory distress. He has no wheezes. He exhibits no tenderness.  Abdominal: Soft. Bowel sounds are normal. He exhibits no distension. There is no tenderness. There is no rebound and no  guarding.  Musculoskeletal: Normal range of motion. He exhibits tenderness. He exhibits no edema.       Feet are tender with flexion.  Neurological: He is alert and oriented to person, place, and time. No cranial nerve deficit.  Skin: Skin is warm and dry. No rash noted. He is not diaphoretic. No erythema. No pallor.  Psychiatric: He has a normal mood and affect. His behavior is normal.       Assessment/Plan:   1. Foot pain-the patient likely has some plantar fasciitis. Advised him on the proper use of stretching exercises and advised him to try these twice daily for several weeks to a month. Also advised him that he can continue using Aleve at this time for his pain. He could also try Tylenol over-the-counter.  2. Hypertension-the patient's blood pressure was elevated at today's visit 155/93. During visit mentioned that him that he could trial a blood pressure medication to help him get better control and he was not agreeing to that. He states that he would like to try diet and exercise as his exercise has been poor and his diet has been poor. Did speak with the patient again after talking to the attending and he continues to state that he would not like to start medication at today's visit but would be willing to start medication at next visit if this diet and exercise intervention does not work. I think that in the interest of managing a chronic medical problem it's reasonable to involve the patient in his care and as most benefit from hypertension treatment is on the order of  decades would be reasonable to delay starting treatment for 3 months to get patient cooperation. However at repeat visit blood pressure medication needs to be initiated. Would recommend HCTZ as starting point.  3. Disposition-did instruct patient on stretching exercises. He will come back in 3 months. Also advised the initiation of hypertension treatment and he would like to delay that. He was given refill on Kenalog ointment  for his eczema. He was given Tdap at today's visit.

## 2011-12-16 NOTE — Progress Notes (Signed)
A chart review was performed by me Dr.Brandell Maready IM attending and assessment of Mr.Crumpler by Dr.Kollar was evaluated. This patient has had uncontrolled BP from 2010 from what has been documented in EPIC. His lipids are also elevated and it does not look like he is very regular in his visits to the clinic. These chronic conditions need to be addressed at every given opportunity.and he should have repeat lipid levels checked and he needs to be on anti HTN medications.

## 2012-01-26 ENCOUNTER — Emergency Department (HOSPITAL_COMMUNITY): Payer: Worker's Compensation

## 2012-01-26 ENCOUNTER — Emergency Department (HOSPITAL_COMMUNITY)
Admission: EM | Admit: 2012-01-26 | Discharge: 2012-01-26 | Disposition: A | Discharge status: Home / Self Care | Payer: Worker's Compensation | Attending: Emergency Medicine | Admitting: Emergency Medicine

## 2012-01-26 ENCOUNTER — Encounter (HOSPITAL_COMMUNITY): Payer: Self-pay | Admitting: Emergency Medicine

## 2012-01-26 DIAGNOSIS — IMO0002 Reserved for concepts with insufficient information to code with codable children: Secondary | ICD-10-CM | POA: Insufficient documentation

## 2012-01-26 DIAGNOSIS — S0003XA Contusion of scalp, initial encounter: Secondary | ICD-10-CM | POA: Insufficient documentation

## 2012-01-26 DIAGNOSIS — R51 Headache: Secondary | ICD-10-CM

## 2012-01-26 DIAGNOSIS — Y9289 Other specified places as the place of occurrence of the external cause: Secondary | ICD-10-CM | POA: Insufficient documentation

## 2012-01-26 DIAGNOSIS — I10 Essential (primary) hypertension: Secondary | ICD-10-CM | POA: Insufficient documentation

## 2012-01-26 MED ORDER — ACETAMINOPHEN 325 MG PO TABS
650.0000 mg | ORAL_TABLET | Freq: Once | ORAL | Status: AC
  Administered 2012-01-26: 650 mg via ORAL
  Filled 2012-01-26: qty 2

## 2012-01-26 NOTE — ED Provider Notes (Signed)
History    This chart was scribed for Suzi Roots, MD, MD by Smitty Pluck. The patient was seen in room TR10C and the patient's care was started at 12:04PM.   CSN: [id number]  Arrival date & time 01/26/12  1134       Chief Complaint  Patient presents with  . Head Injury    (Consider location/radiation/quality/duration/timing/severity/associated sxs/prior treatment) Patient is a 51 y.o. male presenting with head injury. The history is provided by the patient. No language interpreter was used.  Head Injury  Pertinent negatives include no numbness, no vomiting and no weakness.   Tyler Burch is a 51 y.o. male who presents to the Emergency Department complaining of constant, moderate right head pain onset 5 days ago due to a metal bar hitting him in the head at work. Pt denies speech problems, numbness, vomiting, nausea, LOC, neck pain, abdominal pain, chest pain, and weakness. No neck or back pain. No eye pain or change in vision. No acute or abrupt change in pain today, but persistent since injury. Constant, dull, non radiating, no specific exacerbating or alleviating factors.     Past Medical History  Diagnosis Date  . Hypertension     History reviewed. No pertinent past surgical history.  History reviewed. No pertinent family history.  History  Substance Use Topics  . Smoking status: Never Smoker   . Smokeless tobacco: Not on file  . Alcohol Use: Yes      Review of Systems  Constitutional: Negative for fever and chills.  HENT: Negative for neck pain.   Eyes: Negative for visual disturbance.  Respiratory: Negative for shortness of breath.   Cardiovascular: Negative for chest pain.  Gastrointestinal: Negative for nausea, vomiting and abdominal pain.  Neurological: Negative for syncope, weakness and numbness.    Allergies  Shellfish allergy  Home Medications   Current Outpatient Rx  Name Route Sig Dispense Refill  . NAPROXEN SODIUM 220 MG PO CAPS Oral Take 440  mg by mouth 2 (two) times daily as needed. For pain    . TRIAMCINOLONE ACETONIDE 0.025 % EX OINT Topical Apply topically 2 (two) times daily. 454 g 1    BP 172/103  Pulse 62  Temp 98.1 F (36.7 C) (Oral)  Resp 16  SpO2 99%  Physical Exam  Nursing note and vitals reviewed. Constitutional: He is oriented to person, place, and time. He appears well-developed and well-nourished. No distress.  HENT:  Head: Normocephalic and atraumatic.       Tenderness to right scalp. No sinus or temporal tenderness  Eyes: Conjunctivae normal are normal. Pupils are equal, round, and reactive to light. No scleral icterus.  Neck: Normal range of motion. Neck supple. No tracheal deviation present.       No stiffness/rigidity  Cardiovascular: Normal rate, regular rhythm, normal heart sounds and intact distal pulses.   Pulmonary/Chest: Effort normal and breath sounds normal. No respiratory distress. He exhibits no tenderness.  Abdominal: Soft. He exhibits no distension. There is no tenderness.  Musculoskeletal: Normal range of motion.       CTLS spine, non tender, aligned, no step off.   Neurological: He is alert and oriented to person, place, and time.       Motor intact bil. Steady gait.   Skin: Skin is warm and dry. No rash noted.  Psychiatric: He has a normal mood and affect. His behavior is normal.    ED Course  Procedures (including critical care time) DIAGNOSTIC STUDIES: Oxygen Saturation is 99%  on room air, normal by my interpretation.    COORDINATION OF CARE: 12:06 PM Discussed ED treatment with pt    Ct Head Wo Contrast  01/26/2012  *RADIOLOGY REPORT*  Clinical Data: Rolling pin fell on head last week, head pain, right eye pain, and blurred vision.  CT HEAD WITHOUT CONTRAST  Technique:  Contiguous axial images were obtained from the base of the skull through the vertex without contrast.  Comparison: None.  Findings: There is no evidence for acute infarction, intracranial hemorrhage, mass  lesion, hydrocephalus, or extra-axial fluid. There is no visible atrophy or white matter disease.  There is a large scalp hematoma in the right temporalis muscle region (see for instance image 13 series 2). There is no underlying skull or visible facial fracture.  The visualized orbits, paranasal sinuses, and mastoids are clear.  IMPRESSION: Large right temporalis region scalp hematoma.  No underlying skull fracture or intracranial hemorrhage.   Original Report Authenticated By: Elsie Stain, M.D.         MDM  I personally performed the services described in this documentation, which was scribed in my presence. The recorded information has been reviewed and considered. Suzi Roots, MD  Ct.   Tylenol po.     Suzi Roots, MD 01/26/12 1310

## 2012-01-26 NOTE — ED Notes (Signed)
Pt sts rolling pin feel onto head last Wednesday and now having increased pain on right side of head; pt denies LOC

## 2012-03-08 ENCOUNTER — Encounter: Payer: BC Managed Care – PPO | Admitting: Internal Medicine

## 2012-11-20 ENCOUNTER — Emergency Department (HOSPITAL_COMMUNITY)
Admission: EM | Admit: 2012-11-20 | Discharge: 2012-11-20 | Disposition: A | Discharge status: Home / Self Care | Payer: Self-pay | Attending: Emergency Medicine | Admitting: Emergency Medicine

## 2012-11-20 ENCOUNTER — Encounter (HOSPITAL_COMMUNITY): Payer: Self-pay | Admitting: *Deleted

## 2012-11-20 DIAGNOSIS — Z8639 Personal history of other endocrine, nutritional and metabolic disease: Secondary | ICD-10-CM | POA: Insufficient documentation

## 2012-11-20 DIAGNOSIS — I10 Essential (primary) hypertension: Secondary | ICD-10-CM

## 2012-11-20 DIAGNOSIS — M109 Gout, unspecified: Secondary | ICD-10-CM

## 2012-11-20 DIAGNOSIS — Z862 Personal history of diseases of the blood and blood-forming organs and certain disorders involving the immune mechanism: Secondary | ICD-10-CM | POA: Insufficient documentation

## 2012-11-20 HISTORY — DX: Gout, unspecified: M10.9

## 2012-11-20 MED ORDER — OXYCODONE-ACETAMINOPHEN 5-325 MG PO TABS
2.0000 | ORAL_TABLET | Freq: Once | ORAL | Status: AC
  Administered 2012-11-20: 2 via ORAL
  Filled 2012-11-20: qty 2

## 2012-11-20 MED ORDER — PREDNISONE 20 MG PO TABS
60.0000 mg | ORAL_TABLET | Freq: Once | ORAL | Status: AC
  Administered 2012-11-20: 60 mg via ORAL
  Filled 2012-11-20: qty 3

## 2012-11-20 MED ORDER — PREDNISONE 10 MG PO TABS: ORAL_TABLET | ORAL | Status: DC

## 2012-11-20 MED ORDER — OXYCODONE-ACETAMINOPHEN 5-325 MG PO TABS: 1.0000 | ORAL_TABLET | Freq: Four times a day (QID) | ORAL | Status: DC | PRN

## 2012-11-20 NOTE — ED Notes (Addendum)
1925  Pt arrived to the room via wheelchair with left great toe pain.  Pt states he thinks it is gout as he has had it before.  Pts toe is warm and swollen at this time.  Pt also states he has a small place on the bottom of his foot that hurts.  Pt is undressed and awaiting the MD  2030  Pt relaxing in the room at this time.  No new needs

## 2012-11-20 NOTE — ED Provider Notes (Signed)
  CSN: [id number]     Arrival date & time 11/20/12  1911 History     First MD Initiated Contact with Patient 11/20/12 1945     Chief Complaint  Patient presents with  . Toe Pain   (Consider location/radiation/quality/duration/timing/severity/associated sxs/prior Treatment) The history is provided by the patient.  Alam Guterrez is a 52 y.o. male hx of HTN, gout here with L big toe pain. He noticed L big toe pain for the last 2 days, worse with walking. He works at [employer] and is on his feet a lot. He took some ibuprofen with no relief. He felt like his gout flare previously. Denies fever or chills or foot injury.    Past Medical History  Diagnosis Date  . Hypertension   . Gout    No past surgical history on file. No family history on file. History  Substance Use Topics  . Smoking status: Never Smoker   . Smokeless tobacco: Not on file  . Alcohol Use: Yes    Review of Systems  Musculoskeletal:       L foot pain   All other systems reviewed and are negative.    Allergies  Shellfish allergy  Home Medications   Current Outpatient Rx  Name  Route  Sig  Dispense  Refill  . ibuprofen (ADVIL,MOTRIN) 200 MG tablet   Oral   Take 800 mg by mouth every 6 (six) hours as needed for pain.         . naproxen sodium (ANAPROX) 220 MG tablet   Oral   Take 660 mg by mouth 2 (two) times daily with a meal.          BP 192/112  Pulse 58  Temp(Src) 98 F (36.7 C) (Oral)  Resp 18  SpO2 100% Physical Exam  Nursing note and vitals reviewed. Constitutional: He is oriented to person, place, and time. He appears well-developed.  Uncomfortable   HENT:  Head: Normocephalic.  Eyes: Conjunctivae are normal. Pupils are equal, round, and reactive to light.  Neck: Normal range of motion. Neck supple.  Cardiovascular: Normal rate.   Pulmonary/Chest: Effort normal.  Abdominal: Soft.  Musculoskeletal: Normal range of motion.  L big toe swollen and tender. 2+ DP pulses. Good capillary  refill.   Neurological: He is alert and oriented to person, place, and time.  Skin: Skin is warm and dry.  Psychiatric: He has a normal mood and affect. His behavior is normal. Judgment and thought content normal.    ED Course   Procedures (including critical care time)  Labs Reviewed - No data to display No results found. No diagnosis found.  MDM  Coulton Schlink is a 52 y.o. male here with L big toe pain, likely from gout. HTN likely from pain, improved with pain meds and steroids. Will d/c home on course of steroids and percocet and motrin.    Richardean Canal, MD 11/20/12 2115

## 2012-11-20 NOTE — ED Notes (Signed)
Pt c/o left toe pain x 2days without relief from ibuprofen. Rating pain 10 out 10. States it feels like Gout flare up

## 2012-11-26 DIAGNOSIS — IMO0002 Reserved for concepts with insufficient information to code with codable children: Secondary | ICD-10-CM | POA: Insufficient documentation

## 2012-11-26 DIAGNOSIS — I10 Essential (primary) hypertension: Secondary | ICD-10-CM | POA: Insufficient documentation

## 2012-11-26 DIAGNOSIS — M7660 Achilles tendinitis, unspecified leg: Secondary | ICD-10-CM | POA: Insufficient documentation

## 2012-11-26 DIAGNOSIS — M109 Gout, unspecified: Secondary | ICD-10-CM | POA: Insufficient documentation

## 2012-11-27 ENCOUNTER — Emergency Department (HOSPITAL_COMMUNITY)
Admission: EM | Admit: 2012-11-27 | Discharge: 2012-11-27 | Disposition: A | Discharge status: Home / Self Care | Payer: Commercial Indemnity | Attending: Emergency Medicine | Admitting: Emergency Medicine

## 2012-11-27 ENCOUNTER — Encounter (HOSPITAL_COMMUNITY): Payer: Self-pay | Admitting: *Deleted

## 2012-11-27 DIAGNOSIS — M7662 Achilles tendinitis, left leg: Secondary | ICD-10-CM

## 2012-11-27 NOTE — ED Notes (Addendum)
Pt c/o L achilles area pain, worse with flex/ extension and walking. Denies calcaneous, ankle or foot pain. Up to lower calf. Onset at 0300 Friday morning while sleeping. seen recently for gout in same foot, sx not like previous/recent gout. no swelling redness markings or bruising noted. Denies injury. Onset while sleeping. Some temporary relief with aleve Friday. Recent meds prescribed for gout. Ambulatory to room. CMS intact. ROM limited at extremes d/t pain. 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

## 2012-11-27 NOTE — ED Notes (Signed)
PA notified of patient's BP. Pt reports hx HTN and takes medications for this however, he has not taken his medications today. Patient instructed to prescribed HTN medications ASAP. Pt states that he will do so. Patient denies any headache, dizziness or vision changes/disturbances at discharge.

## 2012-11-27 NOTE — ED Notes (Addendum)
Ortho tech at bedside w/ cam walker. Patient informed staff that he already has one of these at home. Cam walker not applied. Patient will use the one he already has

## 2012-11-27 NOTE — ED Notes (Signed)
Ortho paged for CAM walker per EDPA.

## 2012-11-27 NOTE — ED Notes (Signed)
Not takinf his bp meds every day

## 2012-11-27 NOTE — ED Provider Notes (Signed)
Medical screening examination/treatment/procedure(s) were performed by non-physician practitioner and as supervising physician I was immediately available for consultation/collaboration.   Milea Klink, MD 11/27/12 0739 

## 2012-11-27 NOTE — ED Notes (Signed)
ED PA at BS 

## 2012-11-27 NOTE — ED Provider Notes (Signed)
CSN: [id number]     Arrival date & time 11/26/12  2359 History     None    Chief Complaint  Patient presents with  . Ankle Pain   (Consider location/radiation/quality/duration/timing/severity/associated sxs/prior Treatment) HPI History provided by pt.   Pt diagnosed w/ gout of L great toe one week ago.  Yesterday he developed pain in L posterior heel that woke him from sleep at 3am.  Non-radiating, aggravated by bearing weight and flexion, improved w/ naproxen and no associated edema or skin changes.  Denies trauma.   Past Medical History  Diagnosis Date  . Hypertension   . Gout    History reviewed. No pertinent past surgical history. No family history on file. History  Substance Use Topics  . Smoking status: Never Smoker   . Smokeless tobacco: Not on file  . Alcohol Use: Yes    Review of Systems  All other systems reviewed and are negative.    Allergies  Shellfish allergy  Home Medications   Current Outpatient Rx  Name  Route  Sig  Dispense  Refill  . ibuprofen (ADVIL,MOTRIN) 200 MG tablet   Oral   Take 800 mg by mouth every 6 (six) hours as needed for pain.         . naproxen sodium (ANAPROX) 220 MG tablet   Oral   Take 660 mg by mouth 2 (two) times daily with a meal.         . oxyCODONE-acetaminophen (PERCOCET) 5-325 MG per tablet   Oral   Take 1 tablet by mouth every 6 (six) hours as needed for pain.   15 tablet   0   . predniSONE (DELTASONE) 10 MG tablet      Take 40mg  daily for 2 days then 30 mg daily for 2 days then 20 mg daily for 2 days then 10 mg daily for 2 days then stop   20 tablet   0    BP 157/97  Pulse 64  Temp(Src) 97.7 F (36.5 C)  Resp 18  SpO2 97% Physical Exam  Nursing note and vitals reviewed. Constitutional: He is oriented to person, place, and time. He appears well-developed and well-nourished. No distress.  HENT:  Head: Normocephalic and atraumatic.  Eyes:  Normal appearance  Neck: Normal range of motion.   Pulmonary/Chest: Effort normal.  Musculoskeletal: Normal range of motion.  Mild erythema and edema over metatarsal-proximal phalanx joint of L great toe.  Mild pain w/ palpation and passive flexion.  No edema or skin changes of ankle.  Achilles tendon intact by palpation and Thompson test negative.  Tenderness over achilles and pain w/ passive ankle ROM. No edema or tenderness of calf.  2+ DP pulse and distal sensation intact.    Neurological: He is alert and oriented to person, place, and time.  Psychiatric: He has a normal mood and affect. His behavior is normal.    ED Course   Procedures (including critical care time)  Labs Reviewed - No data to display No results found. 1. Tendonitis, Achilles, left     MDM  52yo M, recently diagnosed w/ gout of left great toe, presents w/ non-traumatic pain over L achilles tendon since yesterday am.  Tendon palpable and Thompson test negative.  No overlying skin changes or calf edema/pain.  Suspect tendinitis, likely secondary to favoring great toe.  He has a cam walker at home.  I recommended ice, elevation and naproxen bid.  Referred to ortho for persistent/worsening sx. Return precautions discussed.  1:14 AM   Otilio Miu, PA-C 11/27/12 [phone]

## 2012-11-27 NOTE — ED Notes (Signed)
The pt is c/o lt heel pain since last pm.  He has been treated for gout here last week in the same extremity.  He left work to come here

## 2012-11-30 ENCOUNTER — Emergency Department (HOSPITAL_COMMUNITY): Payer: Commercial Indemnity

## 2012-11-30 ENCOUNTER — Emergency Department (HOSPITAL_COMMUNITY)
Admission: EM | Admit: 2012-11-30 | Discharge: 2012-12-01 | Disposition: A | Discharge status: Home / Self Care | Payer: Commercial Indemnity | Attending: Emergency Medicine | Admitting: Emergency Medicine

## 2012-11-30 ENCOUNTER — Encounter (HOSPITAL_COMMUNITY): Payer: Self-pay | Admitting: Emergency Medicine

## 2012-11-30 DIAGNOSIS — M25475 Effusion, left foot: Secondary | ICD-10-CM

## 2012-11-30 DIAGNOSIS — M25473 Effusion, unspecified ankle: Secondary | ICD-10-CM | POA: Insufficient documentation

## 2012-11-30 DIAGNOSIS — I10 Essential (primary) hypertension: Secondary | ICD-10-CM | POA: Insufficient documentation

## 2012-11-30 DIAGNOSIS — Z79899 Other long term (current) drug therapy: Secondary | ICD-10-CM | POA: Insufficient documentation

## 2012-11-30 DIAGNOSIS — Z8639 Personal history of other endocrine, nutritional and metabolic disease: Secondary | ICD-10-CM | POA: Insufficient documentation

## 2012-11-30 DIAGNOSIS — M25476 Effusion, unspecified foot: Secondary | ICD-10-CM | POA: Insufficient documentation

## 2012-11-30 DIAGNOSIS — Z862 Personal history of diseases of the blood and blood-forming organs and certain disorders involving the immune mechanism: Secondary | ICD-10-CM | POA: Insufficient documentation

## 2012-11-30 NOTE — ED Notes (Signed)
PT. REPORTS PAIN / SWELLING AT LEFT ANKLE ONSET LAST WEEK , DENIES INJURY , AMBULATORY , PT. STATED HISTORY OF GOUT.

## 2012-12-01 ENCOUNTER — Ambulatory Visit (HOSPITAL_COMMUNITY)
Admission: RE | Admit: 2012-12-01 | Discharge: 2012-12-01 | Disposition: A | Discharge status: Home / Self Care | Payer: Commercial Indemnity | Source: Ambulatory Visit | Attending: Emergency Medicine | Admitting: Emergency Medicine

## 2012-12-01 DIAGNOSIS — M79609 Pain in unspecified limb: Secondary | ICD-10-CM

## 2012-12-01 DIAGNOSIS — M7989 Other specified soft tissue disorders: Secondary | ICD-10-CM | POA: Insufficient documentation

## 2012-12-01 LAB — POCT I-STAT, CHEM 8
Calcium, Ion: 1.15 mmol/L (ref 1.12–1.23)
Chloride: 107 mEq/L (ref 96–112)
Glucose, Bld: 92 mg/dL (ref 70–99)
HCT: 37 % — ABNORMAL LOW (ref 39.0–52.0)

## 2012-12-01 MED ORDER — ENOXAPARIN SODIUM 100 MG/ML ~~LOC~~ SOLN
95.0000 mg | Freq: Once | SUBCUTANEOUS | Status: AC
  Administered 2012-12-01: 95 mg via SUBCUTANEOUS
  Filled 2012-12-01: qty 1

## 2012-12-01 MED ORDER — CEPHALEXIN 500 MG PO CAPS: 1000.0000 mg | ORAL_CAPSULE | Freq: Two times a day (BID) | ORAL | Status: DC

## 2012-12-01 MED ORDER — OXYCODONE-ACETAMINOPHEN 5-325 MG PO TABS
1.0000 | ORAL_TABLET | Freq: Once | ORAL | Status: AC
  Administered 2012-12-01: 1 via ORAL
  Filled 2012-12-01: qty 1

## 2012-12-01 MED ORDER — CEPHALEXIN 250 MG PO CAPS
500.0000 mg | ORAL_CAPSULE | Freq: Once | ORAL | Status: AC
  Administered 2012-12-01: 500 mg via ORAL
  Filled 2012-12-01: qty 2

## 2012-12-01 NOTE — Progress Notes (Signed)
*  Preliminary Results* Left lower extremity venous duplex completed. Left lower extremity is negative for deep vein thrombosis. There is no evidence of left Baker's cyst.  12/01/2012 9:16 AM  Gertie Fey, RVT, RDCS, RDMS

## 2012-12-01 NOTE — ED Provider Notes (Signed)
CSN: [id number]     Arrival date & time 11/30/12  2309 History     None    Chief Complaint  Patient presents with  . Ankle Pain   (Consider location/radiation/quality/duration/timing/severity/associated sxs/prior Treatment) HPI History provided by pt and prior chart.  Per prior chart, pt diagnosed w/ gout left great toe on 11/20/12.  Has a prior history as well.  Returned to ER on 8/16 w/ pain over L achilles and diagnosed w/ tendinitis.  Gout pain had improved at that time. Advised to continue bid naproxen, ice and elevate.  Returns again today because his entire L foot and ankle became edematous and painful acutely yesterday.  No relief w/ naproxen.  No associated fever, skin changes, paresthesias, CP/SOB.  No RF for DVT.  Denies trauma.   Past Medical History  Diagnosis Date  . Hypertension   . Gout    History reviewed. No pertinent past surgical history. No family history on file. History  Substance Use Topics  . Smoking status: Never Smoker   . Smokeless tobacco: Not on file  . Alcohol Use: Yes    Review of Systems  All other systems reviewed and are negative.    Allergies  Shellfish allergy  Home Medications   Current Outpatient Rx  Name  Route  Sig  Dispense  Refill  . ibuprofen (ADVIL,MOTRIN) 200 MG tablet   Oral   Take 800 mg by mouth every 6 (six) hours as needed for pain.         . naproxen sodium (ANAPROX) 220 MG tablet   Oral   Take 660 mg by mouth 2 (two) times daily with a meal.         . cephALEXin (KEFLEX) 500 MG capsule   Oral   Take 2 capsules (1,000 mg total) by mouth 2 (two) times daily.   26 capsule   0   . oxyCODONE-acetaminophen (PERCOCET) 5-325 MG per tablet   Oral   Take 1 tablet by mouth every 6 (six) hours as needed for pain.   15 tablet   0   . predniSONE (DELTASONE) 10 MG tablet      Take 40mg  daily for 2 days then 30 mg daily for 2 days then 20 mg daily for 2 days then 10 mg daily for 2 days then stop   20 tablet   0     BP 167/108  Pulse 71  Temp(Src) 98.3 F (36.8 C) (Oral)  Resp 16  SpO2 99% Physical Exam  Nursing note and vitals reviewed. Constitutional: He is oriented to person, place, and time. He appears well-developed and well-nourished. No distress.  HENT:  Head: Normocephalic and atraumatic.  Eyes:  Normal appearance  Neck: Normal range of motion.  Cardiovascular: Normal rate and regular rhythm.   Pulmonary/Chest: Effort normal and breath sounds normal. No respiratory distress.  Musculoskeletal: Normal range of motion.  L foot diffusely edematous.  Edema of lower leg compared to R as well.  Mild, poorly demarcated erythema on instep and medial half dorsal surface of foot.  Calf, entire foot and both medial/lateral ankle mildly ttp.  Pain w/ passive ROM of ankle.  No pain w/ ROM of toes.  2+ DP pulse and distal sensation intact.    Neurological: He is alert and oriented to person, place, and time.  Skin: Skin is warm and dry. No rash noted.  Psychiatric: He has a normal mood and affect. His behavior is normal.    ED Course  Procedures (including critical care time)  Labs Reviewed  POCT I-STAT, CHEM 8 - Abnormal; Notable for the following:    Hemoglobin 12.6 (*)    HCT 37.0 (*)    All other components within normal limits   Dg Ankle Complete Left  11/30/2012   *RADIOLOGY REPORT*  Clinical Data: Left ankle pain.  History of gout.  Pain and swelling in the ankle.  LEFT ANKLE COMPLETE - 3+ VIEW  Comparison: None.  Findings: Diffuse soft tissue swelling about the left ankle.  No bony erosions are demonstrated.  Bone cortex appears intact.  No evidence of acute fracture or subluxation.  Ankle mortis and talar dome appear intact.  No radiopaque soft tissue foreign bodies. Degenerative changes in the intertarsal joints.  Plantar calcaneal spur.  No focal bone lesions.  IMPRESSION: Diffuse soft tissue swelling about the left ankle.  No acute bony abnormalities or bone erosion is demonstrated.    Original Report Authenticated By: Burman Nieves, M.D.   1. Swelling of foot joint, left     MDM  52yo M diagnosed w/ L great toe gout on 8/9 and L achilles tendinitis on 8/16.  Returns today w/ non-traumatic edema/pain of entire L foot/ankle/lower leg.  Gout vs. Cellulitis vs. DVT.  No CP/SOB.  Pt received a dose of lovenox, keflex and percocet.  Ortho tech provided w/ cam walker.  D/c'd home w/ keflex and recommended ice/elevation.  He will return in am for venous duplex.  Return precautions discussed.    Otilio Miu, PA-C 12/01/12 [phone]

## 2012-12-02 NOTE — ED Provider Notes (Signed)
Medical screening examination/treatment/procedure(s) were performed by non-physician practitioner and as supervising physician I was immediately available for consultation/collaboration.    Charles B. Sheldon, MD 12/02/12 1134 

## 2012-12-06 ENCOUNTER — Encounter: Payer: Self-pay | Admitting: Internal Medicine

## 2012-12-06 ENCOUNTER — Ambulatory Visit (INDEPENDENT_AMBULATORY_CARE_PROVIDER_SITE_OTHER): Payer: Self-pay | Admitting: Internal Medicine

## 2012-12-06 VITALS — BP 173/110 | HR 59 | Temp 97.8°F | Ht 72.0 in | Wt 224.7 lb

## 2012-12-06 DIAGNOSIS — Z0000 Encounter for general adult medical examination without abnormal findings: Secondary | ICD-10-CM

## 2012-12-06 DIAGNOSIS — M109 Gout, unspecified: Secondary | ICD-10-CM | POA: Insufficient documentation

## 2012-12-06 DIAGNOSIS — I10 Essential (primary) hypertension: Secondary | ICD-10-CM

## 2012-12-06 MED ORDER — AMLODIPINE BESYLATE 5 MG PO TABS: 5.0000 mg | ORAL_TABLET | Freq: Every day | ORAL | Status: DC

## 2012-12-06 MED ORDER — COLCHICINE 0.6 MG PO TABS: 0.6000 mg | ORAL_TABLET | Freq: Every day | ORAL | Status: DC

## 2012-12-06 NOTE — Progress Notes (Signed)
Patient ID: Tyler Burch, male   DOB: 1960-11-17, 52 y.o.   MRN: [id number]    Subjective:   Patient ID: Tyler Burch male   DOB: 1960-06-15 52 y.o.   MRN: [id number]  HPI: [name] is a 52 y.o. AAM here for a hospital f/u for an acute gout flare.  He has a PMH outlined below.  Please see problem based assessment and plan below for futher details.   Past Medical History  Diagnosis Date  . Hypertension   . Gout    Current Outpatient Prescriptions  Medication Sig Dispense Refill  . amLODipine (NORVASC) 5 MG tablet Take 1 tablet (5 mg total) by mouth daily.  30 tablet  6  . cephALEXin (KEFLEX) 500 MG capsule Take 2 capsules (1,000 mg total) by mouth 2 (two) times daily.  26 capsule  0  . colchicine 0.6 MG tablet Take 1 tablet (0.6 mg total) by mouth daily.  30 tablet  2  . ibuprofen (ADVIL,MOTRIN) 200 MG tablet Take 800 mg by mouth every 6 (six) hours as needed for pain.      . naproxen sodium (ANAPROX) 220 MG tablet Take 660 mg by mouth 2 (two) times daily with a meal.      . oxyCODONE-acetaminophen (PERCOCET) 5-325 MG per tablet Take 1 tablet by mouth every 6 (six) hours as needed for pain.  15 tablet  0  . predniSONE (DELTASONE) 10 MG tablet Take 40mg  daily for 2 days then 30 mg daily for 2 days then 20 mg daily for 2 days then 10 mg daily for 2 days then stop  20 tablet  0   No current facility-administered medications for this visit.   No family history on file. History   Social History  . Marital Status: Married    Spouse Name: N/A    Number of Children: N/A  . Years of Education: N/A   Social History Main Topics  . Smoking status: Never Smoker   . Smokeless tobacco: None  . Alcohol Use: Yes  . Drug Use: None  . Sexual Activity: None   Other Topics Concern  . None   Social History Narrative  . None   Review of Systems:  Pertinent items are noted in HPI.  Objective:  Physical Exam: Filed Vitals:   12/06/12 1029 12/06/12 1225  BP: 159/102 173/110  Pulse: 59     Temp: 97.8 F (36.6 C)   TempSrc: Oral   Height: 6' (1.829 m)   Weight: 224 lb 11.2 oz (101.923 kg)   SpO2: 99%    Physical Exam  Constitutional: He is oriented to person, place, and time and well-developed, well-nourished, and in no distress.  HENT:  Head: Normocephalic and atraumatic.  Eyes: Conjunctivae and EOM are normal. Pupils are equal, round, and reactive to light.  Neck: Neck supple.  Cardiovascular: Normal rate, regular rhythm, normal heart sounds and intact distal pulses.   Pulmonary/Chest: Effort normal and breath sounds normal.  Abdominal: Soft. Bowel sounds are normal. There is no tenderness.  Musculoskeletal: Normal range of motion.  Neurological: He is alert and oriented to person, place, and time.  Skin: Skin is warm and dry.    Assessment & Plan:

## 2012-12-06 NOTE — Patient Instructions (Addendum)
Please return to the clinic in 1-2 months with Dr. Shirlee Latch  1. Please take colchicine 0.6mg  1 tablet daily 2. Please start amlodipine 5mg  1 tablet daily for high blood pressure

## 2012-12-06 NOTE — Assessment & Plan Note (Addendum)
Pt is here for an ED f/u for an acute gout flare of the left great toe gout on 11/20/12 and left achilles tendinitis on 8/16.  He was r/o for DVT.  He received a dose of lovenox, keflex, percocet, and was provided a cam walker. He was sent home w/ keflex and recommended ice/elevation.  Pt reports having a couple previous gout flares. He reports feeling much better today with no new complaints.  On exam there are no pertinent findings.  -begin colchicine 0.6mg  daily; pt was made aware of the possible diarrhea side effect but was urged to continue if this happened -consider d/c colchicine if pt experiences troublesome side effects; and recommend naproxen 500mg  twice daily within 24 hours of an acute attack and continue for 5-7 days or until acute flare subsides -will start allopurinol for prophylaxis when acute flare has completely resolved since pt has experienced a couple of flares

## 2012-12-06 NOTE — Assessment & Plan Note (Signed)
Pt reports a sister with colon cancer to the [facility] student who screens for colonoscopy referral  -ask pt about colonoscopy on next visit since pt is 52 yo

## 2012-12-06 NOTE — Assessment & Plan Note (Signed)
BP Readings from Last 3 Encounters:  12/06/12 173/110  12/01/12 167/108  11/27/12 173/100    Lab Results  Component Value Date   NA 139 12/01/2012   K 4.0 12/01/2012   CREATININE 1.00 12/01/2012    Assessment: Blood pressure control: moderately elevated Progress toward BP goal:  unable to assess  Plan: Medications:  Begin amlodipine 5mg  daily Educational resources provided: brochure Self management tools provided: home blood pressure logbook

## 2012-12-07 NOTE — Progress Notes (Signed)
I saw and evaluated the patient.  I personally confirmed the key portions of Dr. Shiela Mayer history and exam and reviewed pertinent patient test results.  The assessment, diagnosis, and plan were formulated together and I agree with the documentation in the resident's note.  Will start colchicine now and in 4-6 weeks after his acute flare start allopurinol.  Will subsequently titrate the dose of allopurinol to decrease the uric acid to less than 6.0.  Once the uric acid is less than 6.0 for 6 months will discontinue the colchicine.

## 2012-12-09 ENCOUNTER — Telehealth: Payer: Self-pay | Admitting: Internal Medicine

## 2012-12-09 ENCOUNTER — Encounter: Payer: Self-pay | Admitting: Internal Medicine

## 2012-12-09 NOTE — Telephone Encounter (Signed)
At appt refer for colonoscopy sister with colon cancer

## 2013-01-20 ENCOUNTER — Encounter: Payer: Self-pay | Admitting: Internal Medicine

## 2013-07-21 ENCOUNTER — Encounter: Payer: Self-pay | Admitting: Internal Medicine

## 2013-09-01 ENCOUNTER — Encounter: Payer: Self-pay | Admitting: Internal Medicine

## 2013-09-28 ENCOUNTER — Encounter: Payer: Self-pay | Admitting: Internal Medicine

## 2013-10-18 ENCOUNTER — Encounter: Payer: Self-pay | Admitting: Internal Medicine

## 2013-10-24 ENCOUNTER — Other Ambulatory Visit: Payer: Self-pay | Admitting: *Deleted

## 2013-10-24 MED ORDER — AMLODIPINE BESYLATE 5 MG PO TABS: 5.0000 mg | ORAL_TABLET | Freq: Every day | ORAL | Status: DC

## 2013-10-24 NOTE — Telephone Encounter (Signed)
Message sent to front desk pool

## 2013-11-03 ENCOUNTER — Ambulatory Visit (INDEPENDENT_AMBULATORY_CARE_PROVIDER_SITE_OTHER): Payer: Self-pay | Admitting: Internal Medicine

## 2013-11-03 ENCOUNTER — Encounter: Payer: Self-pay | Admitting: Internal Medicine

## 2013-11-03 VITALS — BP 158/96 | HR 54 | Temp 97.7°F | Ht 72.0 in | Wt 237.5 lb

## 2013-11-03 DIAGNOSIS — I10 Essential (primary) hypertension: Secondary | ICD-10-CM

## 2013-11-03 DIAGNOSIS — Z0000 Encounter for general adult medical examination without abnormal findings: Secondary | ICD-10-CM

## 2013-11-03 MED ORDER — HYDROCHLOROTHIAZIDE 12.5 MG PO CAPS: 12.5000 mg | ORAL_CAPSULE | Freq: Every day | ORAL | Status: DC

## 2013-11-03 NOTE — Assessment & Plan Note (Addendum)
BP Readings from Last 3 Encounters:  11/03/13 158/96  12/06/12 173/110  12/01/12 167/108    Lab Results  Component Value Date   NA 139 12/01/2012   K 4.0 12/01/2012   CREATININE 1.00 12/01/2012    Assessment: Blood pressure control: moderately elevated (improved still elevated ) Progress toward BP goal:  improved Comments: pt is currently out of medications and n/c with medication for years   Plan: Medications:  will try HCTZ 12.5 mg ([facility] Pt never tried Norvasc and had h/a with Lisinopril. Will titrate up HCTZ to 25 mg if need but caution with higher doses with h/o gout  Educational resources provided: brochure Self management tools provided: home blood pressure logbook Other plans: wanted to check lipid, CMET, CBC today but no insurance once pt f/u with [facility] will order these labs. F/u in 1-3 months and log BP readings

## 2013-11-03 NOTE — Assessment & Plan Note (Signed)
No flare at this time  OTC NSAIDs provide relief

## 2013-11-03 NOTE — Progress Notes (Signed)
   Subjective:    Patient ID: Tyler Burch, male    DOB: 12-17-1960, 53 y.o.   MRN: [id number]  HPI Comments: 53 y.o PMH HTN, gout, insomnia   He presents for f/u  1. HTN-uncontrolled BP 158/96 today. He is not taking Norvasc 5 mg qd as prescribed the last time b/c he never got it filled due to cost and lack of insurance.  He states he honestly has not had blood pressure medication in 1-3 years though he is not sure how long he has not taken BP meds.  He previously was put on Lisinopril but it caused a h/a.  He wants to focus on his health b/c of his wife and job now   2. Gout-takes OTC NSAID not to exceed daily limit with relief of sx's. 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   Cardiovascular: Negative for chest pain.  Gastrointestinal: Negative for constipation and blood in stool.  Genitourinary: Negative for difficulty urinating.  Musculoskeletal: Positive for arthralgias.       Intermittently has jt aches/pains from lifting at his job relieved by NSAIDS  Neurological: Negative for headaches.       Objective:   Physical Exam  Nursing note and vitals reviewed. Constitutional: He is oriented to person, place, and time. He appears well-developed and well-nourished. He is cooperative.  HENT:  Head: Normocephalic and atraumatic.  Mouth/Throat: Oropharynx is clear and moist and mucous membranes are normal. No oropharyngeal exudate.  Eyes: Conjunctivae are normal. Pupils are equal, round, and reactive to light. Right eye exhibits no discharge. Left eye exhibits no discharge. No scleral icterus.  Cardiovascular: Normal rate, regular rhythm and normal heart sounds.   No murmur heard. No lower ext edema   Pulmonary/Chest: Effort normal and breath sounds  normal.  Abdominal: Soft. Bowel sounds are normal. He exhibits no distension. There is no tenderness.  Musculoskeletal: He exhibits no edema.  Neurological: He is alert and oriented to person, place, and time. He has normal strength. Gait normal.  CN 2-12 grossly intact   Skin: Skin is warm, dry and intact. No rash noted.  Psychiatric: He has a normal mood and affect. His speech is normal and behavior is normal. Judgment and thought content normal. Cognition and memory are normal.          Assessment & Plan:  F/u in 1-3 months HTN. Check CMET, lipid, CBC refer for colonoscopy if pt has insurance

## 2013-11-03 NOTE — Assessment & Plan Note (Signed)
Will do stool cards for now until gets insurance then can refer for colonoscopy

## 2013-11-03 NOTE — Patient Instructions (Addendum)
General Instructions: Please meet with Chauncey Reading the financial counselor and bring these items  Please take medications as instructed  Please keep a log of your blood pressure. Take Hydrochlorthiazide 12.5 mg daily for blood pressure Please follow up in 1-3 months to check on your blood pressure and labs Read the information below Take care   Items required to complete an Eligibility Application for [facility]   1. Picture ID (Can't be expired) 2. Current Bill to establish proof of residency 3. W-2 & Tax return (if self-employed include "Schedule C"), if not filing Form 4506 4. 4 current Pay stubs for this year 5. Printout of other income (Social security, unemployment, child support, workmen's comp) 6. Food stamp award letter, if receiving  7. Life Insurance (Need copy of the front page, showing name Ins Co. Name, and face amount). 8. Statement for pension, 401-K, IRS (needs to have current balance) 9. Tax Value for cars, houses, mobile homes, and land (Get from [facility] Tax Department) 10. Disability Paperwork (showing status of case) 11. College students: Print out of [place] received, tuition cost, books, etc. 12. If no Income: Engineer, maintenance of support for free shelter, money, food, Catering manager.  Bring all that you can to your follow up appointment to start the process.   Treatment Goals:  Goals (1 Years of Data) as of 11/03/13         As of Today 12/06/12 12/06/12 12/01/12 11/30/12     Blood Pressure    . Blood Pressure < 140/90  158/96 173/110 159/102 167/108 161/102      Progress Toward Treatment Goals:  Treatment Goal 11/03/2013  Blood pressure improved   Your blood pressure is improved from the last visit but still High  Self Care Goals & Plans:  Self Care Goal 11/03/2013  Manage my medications take my medicines as prescribed; bring my medications to every visit; refill my medications on time; follow the sick day instructions if I am sick  Monitor my health keep track  of my blood pressure  Eat healthy foods drink diet soda or water instead of juice or soda; eat more vegetables; eat foods that are low in salt; eat baked foods instead of fried foods; eat fruit for snacks and desserts; eat smaller portions  Be physically active find an activity I enjoy  Meeting treatment goals maintain the current self-care plan    No flowsheet data found.   Care Management & Community Referrals:  Referral 11/03/2013  Referrals made for care management support none needed  Referrals made to community resources none       DASH Eating Plan DASH stands for "Dietary Approaches to Stop Hypertension." The DASH eating plan is a healthy eating plan that has been shown to reduce high blood pressure (hypertension). Additional health benefits may include reducing the risk of type 2 diabetes mellitus, heart disease, and stroke. The DASH eating plan may also help with weight loss. WHAT DO I NEED TO KNOW ABOUT THE DASH EATING PLAN? For the DASH eating plan, you will follow these general guidelines:  Choose foods with a percent daily value for sodium of less than 5% (as listed on the food label).  Use salt-free seasonings or herbs instead of table salt or sea salt.  Check with your health care provider or pharmacist before using salt substitutes.  Eat lower-sodium products, often labeled as "lower sodium" or "no salt added."  Eat fresh foods.  Eat more vegetables, fruits, and low-fat dairy products.  Choose  whole grains. Look for the word "whole" as the first word in the ingredient list.  Choose fish and skinless chicken or [place] more often than red meat. Limit fish, poultry, and meat to 6 oz (170 g) each day.  Limit sweets, desserts, sugars, and sugary drinks.  Choose heart-healthy fats.  Limit cheese to 1 oz (28 g) per day.  Eat more home-cooked food and less restaurant, buffet, and fast food.  Limit fried foods.  Cook foods using methods other than  frying.  Limit canned vegetables. If you do use them, rinse them well to decrease the sodium.  When eating at a restaurant, ask that your food be prepared with less salt, or no salt if possible. WHAT FOODS CAN I EAT? Seek help from a dietitian for individual calorie needs. Grains Whole grain or whole wheat bread. Brown rice. Whole grain or whole wheat pasta. Quinoa, bulgur, and whole grain cereals. Low-sodium cereals. Corn or whole wheat flour tortillas. Whole grain cornbread. Whole grain crackers. Low-sodium crackers. Vegetables Fresh or frozen vegetables (raw, steamed, roasted, or grilled). Low-sodium or reduced-sodium tomato and vegetable juices. Low-sodium or reduced-sodium tomato sauce and paste. Low-sodium or reduced-sodium canned vegetables.  Fruits All fresh, canned (in natural juice), or frozen fruits. Meat and Other Protein Products Ground beef (85% or leaner), grass-fed beef, or beef trimmed of fat. Skinless chicken or [place]key. Ground chicken or [place]key. Pork trimmed of fat. All fish and seafood. Eggs. Dried beans, peas, or lentils. Unsalted nuts and seeds. Unsalted canned beans. Dairy Low-fat dairy products, such as skim or 1% milk, 2% or reduced-fat cheeses, low-fat ricotta or cottage cheese, or plain low-fat yogurt. Low-sodium or reduced-sodium cheeses. Fats and Oils Tub margarines without trans fats. Light or reduced-fat mayonnaise and salad dressings (reduced sodium). Avocado. Safflower, olive, or canola oils. Natural peanut or almond butter. Other Unsalted popcorn and pretzels. The items listed above may not be a complete list of recommended foods or beverages. Contact your dietitian for more options. WHAT FOODS ARE NOT RECOMMENDED? Grains White bread. White pasta. White rice. Refined cornbread. Bagels and croissants. Crackers that contain trans fat. Vegetables Creamed or fried vegetables. Vegetables in a cheese sauce. Regular canned vegetables. Regular canned tomato sauce  and paste. Regular tomato and vegetable juices. Fruits Dried fruits. Canned fruit in light or heavy syrup. Fruit juice. Meat and Other Protein Products Fatty cuts of meat. Ribs, chicken wings, bacon, sausage, bologna, salami, chitterlings, fatback, hot dogs, bratwurst, and packaged luncheon meats. Salted nuts and seeds. Canned beans with salt. Dairy Whole or 2% milk, cream, half-and-half, and cream cheese. Whole-fat or sweetened yogurt. Full-fat cheeses or blue cheese. Nondairy creamers and whipped toppings. Processed cheese, cheese spreads, or cheese curds. Condiments Onion and garlic salt, seasoned salt, table salt, and sea salt. Canned and packaged gravies. Worcestershire sauce. Tartar sauce. Barbecue sauce. Teriyaki sauce. Soy sauce, including reduced sodium. Steak sauce. Fish sauce. Oyster sauce. Cocktail sauce. Horseradish. Ketchup and mustard. Meat flavorings and tenderizers. Bouillon cubes. Hot sauce. Tabasco sauce. Marinades. Taco seasonings. Relishes. Fats and Oils Butter, stick margarine, lard, shortening, ghee, and bacon fat. Coconut, palm kernel, or palm oils. Regular salad dressings. Other Pickles and olives. Salted popcorn and pretzels. The items listed above may not be a complete list of foods and beverages to avoid. Contact your dietitian for more information. WHERE CAN I FIND MORE INFORMATION? National Heart, Lung, and Blood Institute: CablePromo.itwww.nhlbi.nih.gov/health/health-topics/topics/dash/ Document Released: 03/20/2011 Document Revised: 08/15/2013 Document Reviewed: 02/02/2013 [facility] Patient Information 2015 [place], [place]. This information  is not intended to replace advice given to you by your health care provider. Make sure you discuss any questions you have with your health care provider.  Hypertension Hypertension, commonly called high blood pressure, is when the force of blood pumping through your arteries is too strong. Your arteries are the blood vessels that carry blood  from your heart throughout your body. A blood pressure reading consists of a higher number over a lower number, such as 110/72. The higher number (systolic) is the pressure inside your arteries when your heart pumps. The lower number (diastolic) is the pressure inside your arteries when your heart relaxes. Ideally you want your blood pressure below 120/80. Hypertension forces your heart to work harder to pump blood. Your arteries may become narrow or stiff. Having hypertension puts you at risk for heart disease, stroke, and other problems.  RISK FACTORS Some risk factors for high blood pressure are controllable. Others are not.  Risk factors you cannot control include:   Race. You may be at higher risk if you are African American.  Age. Risk increases with age.  Gender. Men are at higher risk than women before age 18 years. After age 42, women are at higher risk than men. Risk factors you can control include:  Not getting enough exercise or physical activity.  Being overweight.  Getting too much fat, sugar, calories, or salt in your diet.  Drinking too much alcohol. SIGNS AND SYMPTOMS Hypertension does not usually cause signs or symptoms. Extremely high blood pressure (hypertensive crisis) may cause headache, anxiety, shortness of breath, and nosebleed. DIAGNOSIS  To check if you have hypertension, your health care provider will measure your blood pressure while you are seated, with your arm held at the level of your heart. It should be measured at least twice using the same arm. Certain conditions can cause a difference in blood pressure between your right and left arms. A blood pressure reading that is higher than normal on one occasion does not mean that you need treatment. If one blood pressure reading is high, ask your health care provider about having it checked again. TREATMENT  Treating high blood pressure includes making lifestyle changes and possibly taking medicine. Living a  healthy lifestyle can help lower high blood pressure. You may need to change some of your habits. Lifestyle changes may include:  Following the DASH diet. This diet is high in fruits, vegetables, and whole grains. It is low in salt, red meat, and added sugars.  Getting at least 2 hours of brisk physical activity every week.  Losing weight if necessary.  Not smoking.  Limiting alcoholic beverages.  Learning ways to reduce stress. If lifestyle changes are not enough to get your blood pressure under control, your health care provider may prescribe medicine. You may need to take more than one. Work closely with your health care provider to understand the risks and benefits. HOME CARE INSTRUCTIONS  Have your blood pressure rechecked as directed by your health care provider.   Take medicines only as directed by your health care provider. Follow the directions carefully. Blood pressure medicines must be taken as prescribed. The medicine does not work as well when you skip doses. Skipping doses also puts you at risk for problems.   Do not smoke.   Monitor your blood pressure at home as directed by your health care provider. SEEK MEDICAL CARE IF:   You think you are having a reaction to medicines taken.  You have recurrent headaches or  feel dizzy.  You have swelling in your ankles.  You have trouble with your vision. SEEK IMMEDIATE MEDICAL CARE IF:  You develop a severe headache or confusion.  You have unusual weakness, numbness, or feel faint.  You have severe chest or abdominal pain.  You vomit repeatedly.  You have trouble breathing. MAKE SURE YOU:   Understand these instructions.  Will watch your condition.  Will get help right away if you are not doing well or get worse. Document Released: 03/31/2005 Document Revised: 08/15/2013 Document Reviewed: 01/21/2013 [facility] Patient Information 2015 [place], [place]. This information is not intended to replace advice  given to you by your health care provider. Make sure you discuss any questions you have with your health care provider.  Hydrochlorothiazide, HCTZ capsules or tablets What is this medicine? HYDROCHLOROTHIAZIDE (hye droe klor oh THYE a zide) is a diuretic. It increases the amount of urine passed, which causes the body to lose salt and water. This medicine is used to treat high blood pressure. It is also reduces the swelling and water retention caused by various medical conditions, such as heart, liver, or kidney disease. This medicine may be used for other purposes; ask your health care provider or pharmacist if you have questions. COMMON BRAND NAME(S): Esidrix, Ezide, HydroDIURIL, Microzide, Oretic, Zide What should I tell my health care provider before I take this medicine? They need to know if you have any of these conditions: -diabetes -gout -immune system problems, like lupus -kidney disease or kidney stones -liver disease -pancreatitis -small amount of urine or difficulty passing urine -an unusual or allergic reaction to hydrochlorothiazide, sulfa drugs, other medicines, foods, dyes, or preservatives -pregnant or trying to get pregnant -breast-feeding How should I use this medicine? Take this medicine by mouth with a glass of water. Follow the directions on the prescription label. Take your medicine at regular intervals. Remember that you will need to pass urine frequently after taking this medicine. Do not take your doses at a time of day that will cause you problems. Do not stop taking your medicine unless your doctor tells you to. Talk to your pediatrician regarding the use of this medicine in children. Special care may be needed. Overdosage: If you think you have taken too much of this medicine contact a poison control center or emergency room at once. NOTE: This medicine is only for you. Do not share this medicine with others. What if I miss a dose? If you miss a dose, take it as  soon as you can. If it is almost time for your next dose, take only that dose. Do not take double or extra doses. What may interact with this medicine? -cholestyramine -colestipol -digoxin -dofetilide -lithium -medicines for blood pressure -medicines for diabetes -medicines that relax muscles for surgery -other diuretics -steroid medicines like prednisone or cortisone This list may not describe all possible interactions. Give your health care provider a list of all the medicines, herbs, non-prescription drugs, or dietary supplements you use. Also tell them if you smoke, drink alcohol, or use illegal drugs. Some items may interact with your medicine. What should I watch for while using this medicine? Visit your doctor or health care professional for regular checks on your progress. Check your blood pressure as directed. Ask your doctor or health care professional what your blood pressure should be and when you should contact him or her. You may need to be on a special diet while taking this medicine. Ask your doctor. Check with your doctor  or health care professional if you get an attack of severe diarrhea, nausea and vomiting, or if you sweat a lot. The loss of too much body fluid can make it dangerous for you to take this medicine. You may get drowsy or dizzy. Do not drive, use machinery, or do anything that needs mental alertness until you know how this medicine affects you. Do not stand or sit up quickly, especially if you are an older patient. This reduces the risk of dizzy or fainting spells. Alcohol may interfere with the effect of this medicine. Avoid alcoholic drinks. This medicine may affect your blood sugar level. If you have diabetes, check with your doctor or health care professional before changing the dose of your diabetic medicine. This medicine can make you more sensitive to the sun. Keep out of the sun. If you cannot avoid being in the sun, wear protective clothing and use  sunscreen. Do not use sun lamps or tanning beds/booths. What side effects may I notice from receiving this medicine? Side effects that you should report to your doctor or health care professional as soon as possible: -allergic reactions such as skin rash or itching, hives, swelling of the lips, mouth, tongue, or throat -changes in vision -chest pain -eye pain -fast or irregular heartbeat -feeling faint or lightheaded, falls -gout attack -muscle pain or cramps -pain or difficulty when passing urine -pain, tingling, numbness in the hands or feet -redness, blistering, peeling or loosening of the skin, including inside the mouth -unusually weak or tired Side effects that usually do not require medical attention (report to your doctor or health care professional if they continue or are bothersome): -change in sex drive or performance -dry mouth -headache -stomach upset This list may not describe all possible side effects. Call your doctor for medical advice about side effects. You may report side effects to FDA at 1-800-FDA-1088. Where should I keep my medicine? Keep out of the reach of children. Store at room temperature between 15 and 30 degrees C (59 and 86 degrees F). Do not freeze. Protect from light and moisture. Keep container closed tightly. Throw away any unused medicine after the expiration date. NOTE: This sheet is a summary. It may not cover all possible information. If you have questions about this medicine, talk to your doctor, pharmacist, or health care provider.  2015, Elsevier/Gold Standard. (2009-11-23 12:57:37)

## 2013-11-07 NOTE — Progress Notes (Signed)
Case discussed with Dr. McLean at the time of the visit.  We reviewed the resident's history and exam and pertinent patient test results.  I agree with the assessment, diagnosis, and plan of care documented in the resident's note.     

## 2014-02-01 ENCOUNTER — Encounter: Payer: Self-pay | Admitting: Internal Medicine

## 2014-02-16 ENCOUNTER — Encounter: Payer: Self-pay | Admitting: Internal Medicine

## 2014-02-20 ENCOUNTER — Encounter: Payer: Self-pay | Admitting: Internal Medicine

## 2014-02-20 ENCOUNTER — Telehealth: Payer: Self-pay | Admitting: *Deleted

## 2014-02-20 NOTE — Telephone Encounter (Signed)
Call to patient to see if he has completed his stool cards and to send if done  Per Dr. Shirlee LatchMcLean .  Message was left to call the Clinics and [facility]   Angelina OkGladys Herbin, RN 02/20/2014 10:09 AM

## 2014-02-23 ENCOUNTER — Ambulatory Visit: Payer: Self-pay | Admitting: Internal Medicine

## 2014-02-24 ENCOUNTER — Ambulatory Visit: Payer: Self-pay | Admitting: Internal Medicine

## 2014-02-24 ENCOUNTER — Encounter: Payer: Self-pay | Admitting: Internal Medicine

## 2014-05-17 ENCOUNTER — Telehealth: Payer: Self-pay | Admitting: Internal Medicine

## 2014-05-17 NOTE — Telephone Encounter (Signed)
Call to patient to confirm appointment for 05/18/14 at 2:15. lmtcb

## 2014-05-18 ENCOUNTER — Encounter: Payer: Self-pay | Admitting: Internal Medicine

## 2014-05-31 ENCOUNTER — Telehealth: Payer: Self-pay | Admitting: Internal Medicine

## 2014-05-31 NOTE — Telephone Encounter (Signed)
Call to patient to confirm appointment for 06/01/14 at 2:45 lmtcb

## 2014-06-01 ENCOUNTER — Encounter: Payer: Self-pay | Admitting: Internal Medicine

## 2014-07-17 ENCOUNTER — Encounter: Payer: Self-pay | Admitting: Internal Medicine

## 2014-08-30 ENCOUNTER — Encounter: Payer: Self-pay | Admitting: *Deleted

## 2014-11-13 ENCOUNTER — Encounter (HOSPITAL_COMMUNITY): Payer: Self-pay | Admitting: *Deleted

## 2014-11-13 ENCOUNTER — Emergency Department (HOSPITAL_COMMUNITY): Payer: Self-pay

## 2014-11-13 ENCOUNTER — Emergency Department (HOSPITAL_COMMUNITY)
Admission: EM | Admit: 2014-11-13 | Discharge: 2014-11-13 | Disposition: A | Discharge status: Home / Self Care | Payer: Self-pay | Attending: Emergency Medicine | Admitting: Emergency Medicine

## 2014-11-13 DIAGNOSIS — N201 Calculus of ureter: Secondary | ICD-10-CM | POA: Insufficient documentation

## 2014-11-13 DIAGNOSIS — I10 Essential (primary) hypertension: Secondary | ICD-10-CM | POA: Insufficient documentation

## 2014-11-13 DIAGNOSIS — Z8739 Personal history of other diseases of the musculoskeletal system and connective tissue: Secondary | ICD-10-CM | POA: Insufficient documentation

## 2014-11-13 LAB — CBC
HCT: 39.4 % (ref 39.0–52.0)
Hemoglobin: 13.3 g/dL (ref 13.0–17.0)
MCH: 28.3 pg (ref 26.0–34.0)
MCHC: 33.8 g/dL (ref 30.0–36.0)
MCV: 83.8 fL (ref 78.0–100.0)
Platelets: 211 10*3/uL (ref 150–400)
RBC: 4.7 MIL/uL (ref 4.22–5.81)
RDW: 14.1 % (ref 11.5–15.5)
WBC: 9.7 10*3/uL (ref 4.0–10.5)

## 2014-11-13 LAB — BASIC METABOLIC PANEL
Anion gap: 11 (ref 5–15)
BUN: 25 mg/dL — ABNORMAL HIGH (ref 6–20)
CO2: 21 mmol/L — ABNORMAL LOW (ref 22–32)
Calcium: 9.2 mg/dL (ref 8.9–10.3)
Chloride: 108 mmol/L (ref 101–111)
Creatinine, Ser: 1.54 mg/dL — ABNORMAL HIGH (ref 0.61–1.24)
GFR calc Af Amer: 58 mL/min — ABNORMAL LOW (ref >60)
GFR calc non Af Amer: 50 mL/min — ABNORMAL LOW (ref >60)
Glucose, Bld: 131 mg/dL — ABNORMAL HIGH (ref 65–99)
Potassium: 4.3 mmol/L (ref 3.5–5.1)
Sodium: 140 mmol/L (ref 135–145)

## 2014-11-13 LAB — URINALYSIS, ROUTINE W REFLEX MICROSCOPIC
Bilirubin Urine: NEGATIVE
Glucose, UA: NEGATIVE mg/dL
Ketones, ur: NEGATIVE mg/dL
Leukocytes, UA: NEGATIVE
Nitrite: NEGATIVE
Protein, ur: NEGATIVE mg/dL
Specific Gravity, Urine: 1.021 (ref 1.005–1.030)
Urobilinogen, UA: 0.2 mg/dL (ref 0.0–1.0)
pH: 5 (ref 5.0–8.0)

## 2014-11-13 LAB — URINE MICROSCOPIC-ADD ON

## 2014-11-13 MED ORDER — SODIUM CHLORIDE 0.9 % IV BOLUS (SEPSIS)
1000.0000 mL | Freq: Once | INTRAVENOUS | Status: AC
  Administered 2014-11-13: 1000 mL via INTRAVENOUS

## 2014-11-13 MED ORDER — OXYCODONE-ACETAMINOPHEN 5-325 MG PO TABS: 1.0000 | ORAL_TABLET | ORAL | Status: DC | PRN

## 2014-11-13 MED ORDER — KETOROLAC TROMETHAMINE 30 MG/ML IJ SOLN
15.0000 mg | Freq: Once | INTRAMUSCULAR | Status: AC
  Administered 2014-11-13: 15 mg via INTRAVENOUS
  Filled 2014-11-13: qty 1

## 2014-11-13 MED ORDER — ONDANSETRON HCL 4 MG/2ML IJ SOLN
4.0000 mg | Freq: Once | INTRAMUSCULAR | Status: AC
  Administered 2014-11-13: 4 mg via INTRAVENOUS
  Filled 2014-11-13: qty 2

## 2014-11-13 MED ORDER — FENTANYL CITRATE (PF) 100 MCG/2ML IJ SOLN
50.0000 ug | Freq: Once | INTRAMUSCULAR | Status: AC
  Administered 2014-11-13: 50 ug via INTRAVENOUS
  Filled 2014-11-13: qty 2

## 2014-11-13 MED ORDER — HYDROMORPHONE HCL 1 MG/ML IJ SOLN
0.5000 mg | Freq: Once | INTRAMUSCULAR | Status: AC
  Administered 2014-11-13: 0.5 mg via INTRAVENOUS
  Filled 2014-11-13: qty 1

## 2014-11-13 MED ORDER — ONDANSETRON HCL 4 MG PO TABS: 4.0000 mg | ORAL_TABLET | Freq: Four times a day (QID) | ORAL | Status: DC

## 2014-11-13 MED ORDER — HYDROMORPHONE HCL 1 MG/ML IJ SOLN
1.0000 mg | Freq: Once | INTRAMUSCULAR | Status: AC
  Administered 2014-11-13: 1 mg via INTRAVENOUS
  Filled 2014-11-13: qty 1

## 2014-11-13 NOTE — ED Notes (Signed)
Patient given urinal to urine sample, patient tried, but unable to give urine sample at this time.

## 2014-11-13 NOTE — ED Notes (Signed)
PT reports Lt flank pain that started on Sunday.

## 2014-11-13 NOTE — ED Notes (Signed)
Patient transported to CT 

## 2014-11-13 NOTE — Discharge Instructions (Signed)

## 2014-11-13 NOTE — ED Notes (Signed)
Family at bedside. 

## 2014-11-13 NOTE — ED Notes (Signed)
Patient is alert and orientedx4.  Patient was explained discharge instructions and they understood them with no questions.  The patient's wife, Reina Fuse is taking the patient home.

## 2014-11-13 NOTE — ED Notes (Signed)
Introduced myself to the patient and his spouse. 

## 2014-11-15 NOTE — ED Provider Notes (Signed)
CSN: [id number]     Arrival date & time 11/13/14  1726 History   First MD Initiated Contact with Patient 11/13/14 1802     Chief Complaint  Patient presents with  . Flank Pain     (Consider location/radiation/quality/duration/timing/severity/associated sxs/prior Treatment) HPI   54 year old male with left flank pain. Onset Sunday. Intermittent since then. Became much more severe jus prior to arrival. Waxes and wanes without appreciable exacerbating or relieving factors. No history similar type pain. No urinary complaints. No fevers or chills. Nausea and has had dry heaving. No significant past abdominal surgical history.  Past Medical History  Diagnosis Date  . Hypertension   . Gout    History reviewed. No pertinent past surgical history. Family History  Problem Relation Age of Onset  . Colon cancer Sister    History  Substance Use Topics  . Smoking status: Never Smoker   . Smokeless tobacco: Not on file  . Alcohol Use: Yes    Review of Systems  All systems reviewed and negative, other than as noted in HPI.   Allergies  Shellfish allergy and Lisinopril  Home Medications   Prior to Admission medications   Medication Sig Start Date End Date Taking? Authorizing Provider  naproxen sodium (ANAPROX) 220 MG tablet Take 220 mg by mouth 2 (two) times daily as needed (gout flare up).    Yes Historical Provider, MD  hydrochlorothiazide (MICROZIDE) 12.5 MG capsule Take 1 capsule (12.5 mg total) by mouth daily. Patient not taking: Reported on 11/13/2014 11/03/13   Pasty Spillers McLean-Scocozza, MD  ondansetron (ZOFRAN) 4 MG tablet Take 1 tablet (4 mg total) by mouth every 6 (six) hours. 11/13/14   Raeford Razor, MD  oxyCODONE-acetaminophen (PERCOCET/ROXICET) 5-325 MG per tablet Take 1-2 tablets by mouth every 4 (four) hours as needed for severe pain. 11/13/14   Raeford Razor, MD   BP 145/95 mmHg  Pulse 56  Temp(Src) 98.1 F (36.7 C) (Oral)  Resp 18  Ht 6' (1.829 m)  Wt 220 lb (99.791 kg)   BMI 29.83 kg/m2  SpO2 98% Physical Exam  Constitutional: He appears well-developed and well-nourished. No distress.  HENT:  Head: Normocephalic and atraumatic.  Eyes: Conjunctivae are normal. Right eye exhibits no discharge. Left eye exhibits no discharge.  Neck: Neck supple.  Cardiovascular: Normal rate, regular rhythm and normal heart sounds.  Exam reveals no gallop and no friction rub.   No murmur heard. Pulmonary/Chest: Effort normal and breath sounds normal. No respiratory distress.  Abdominal: Soft. He exhibits no distension. There is tenderness.  Mild left flank/left abdominal tenderness without rebound or guarding. No distention.  Musculoskeletal: He exhibits no edema or tenderness.  Neurological: He is alert.  Skin: Skin is warm and dry.  Psychiatric: He has a normal mood and affect. His behavior is normal. Thought content normal.  Nursing note and vitals reviewed.   ED Course  Procedures (including critical care time) Labs Review Labs Reviewed  URINALYSIS, ROUTINE W REFLEX MICROSCOPIC (NOT AT [facility]) - Abnormal; Notable for the following:    APPearance CLOUDY (*)    Hgb urine dipstick LARGE (*)    All other components within normal limits  BASIC METABOLIC PANEL - Abnormal; Notable for the following:    CO2 21 (*)    Glucose, Bld 131 (*)    BUN 25 (*)    Creatinine, Ser 1.54 (*)    GFR calc non Af Amer 50 (*)    GFR calc Af Amer 58 (*)  All other components within normal limits  URINE MICROSCOPIC-ADD ON - Abnormal; Notable for the following:    Casts HYALINE CASTS (*)    All other components within normal limits  CBC    Imaging Review No results found.   Ct Abdomen Pelvis Wo Contrast  11/13/2014   CLINICAL DATA:  Left flank pain since yesterday.  Hematuria.  EXAM: CT ABDOMEN AND PELVIS WITHOUT CONTRAST  TECHNIQUE: Multidetector CT imaging of the abdomen and pelvis was performed following the standard protocol without IV contrast.  COMPARISON:  10/02/2010.   FINDINGS: Mild dilatation of the left renal collecting system and ureter to the level of a 6 mm calculus in the distal left ureter, at the ureterovesical junction. Left perinephric and proximal periureteric soft tissue stranding. No additional urinary tract calculi are seen and no hydronephrosis on the right. A 4.4 cm cyst is demonstrated in the mid to lower right kidney with an interval increase in size.  Unremarkable non contrasted appearance of the liver, spleen, pancreas, gallbladder, adrenal glands and prostate gland. No gastrointestinal abnormalities or enlarged lymph nodes. Normal appearing appendix. Mild right lower lobe atelectasis and minimal left lower lobe atelectasis. Mild lumbar and lower thoracic spine degenerative changes. Small umbilical hernia containing fat.  IMPRESSION: 6 mm distal left ureteral calculus at the ureterovesical junction, causing mild left hydronephrosis and hydroureter.   Electronically Signed   By: Beckie Salts M.D.   On: 11/13/2014 20:15    EKG Interpretation None      MDM   Final diagnoses:  Ureteral stone    54 year old male with left flank pain. Imaging significant for left ureteral stone. Symptoms controlled. Afebrile. Plan expectant management. Return precautions were discussed.    Raeford Razor, MD 11/15/14 2116

## 2014-12-04 ENCOUNTER — Encounter: Payer: Self-pay | Admitting: Internal Medicine

## 2014-12-04 ENCOUNTER — Ambulatory Visit (INDEPENDENT_AMBULATORY_CARE_PROVIDER_SITE_OTHER): Payer: Self-pay | Admitting: Internal Medicine

## 2014-12-04 VITALS — BP 153/83 | HR 65 | Temp 97.8°F | Wt 233.0 lb

## 2014-12-04 DIAGNOSIS — L309 Dermatitis, unspecified: Secondary | ICD-10-CM

## 2014-12-04 DIAGNOSIS — M109 Gout, unspecified: Secondary | ICD-10-CM

## 2014-12-04 DIAGNOSIS — I10 Essential (primary) hypertension: Secondary | ICD-10-CM

## 2014-12-04 DIAGNOSIS — F528 Other sexual dysfunction not due to a substance or known physiological condition: Secondary | ICD-10-CM

## 2014-12-04 DIAGNOSIS — Z23 Encounter for immunization: Secondary | ICD-10-CM

## 2014-12-04 DIAGNOSIS — Z0000 Encounter for general adult medical examination without abnormal findings: Secondary | ICD-10-CM

## 2014-12-04 MED ORDER — BENAZEPRIL HCL 10 MG PO TABS: 10.0000 mg | ORAL_TABLET | Freq: Every day | ORAL | Status: DC

## 2014-12-04 NOTE — Assessment & Plan Note (Signed)
Patient reports history of eczema on his wrists and abdomen for many years. He notes it is worse in the summertime. His eczema is well controlled with OTC triamcinolone cream.  - Continue topical triamcinolone cream PRN

## 2014-12-04 NOTE — Assessment & Plan Note (Signed)
BP Readings from Last 3 Encounters:  12/04/14 153/83  11/13/14 145/95  11/03/13 158/96    Lab Results  Component Value Date   NA 140 11/13/2014   K 4.3 11/13/2014   CREATININE 1.54* 11/13/2014    Assessment: Blood pressure control: mildly elevated Progress toward BP goal:  unchanged Comments: BP mildly elevated. Patient has not taken his HCTZ for several weeks.   Plan: Medications:  Will stop HCTZ as this can precipitate gout flares. Start benazepril 10 mg daily.  Educational resources provided: brochure Self management tools provided: home blood pressure logbook Other plans:  - Patient did not tolerate lisinopril in past due to headaches. Consider switching to atenolol (on $4 list) if he gets headaches with benazepril.

## 2014-12-04 NOTE — Assessment & Plan Note (Addendum)
Flu vaccine today. Patient needs colonoscopy as his sister died at age 52 from colon cancer. However, he is currently uninsured. Will do stool cards for now. If positive will see if we can get him in with GI. Patient provided instructions on how to complete stool cards. Last Hgb 13.3 on 11/13/14. Patient denies any changes in bowel movements, melena, or hematochezia.

## 2014-12-04 NOTE — Assessment & Plan Note (Signed)
Patient reports his most recent flare was 2 weeks ago in his right big toe. He notes he takes naproxen whenever he gets a flare. He reports cutting down on beer and red meat to reduce flares which has had some success.  - Discontinue HCTZ as this can precipitate flares - Check uric acid level at next visit if he has not had a recent flare - Continue naproxen - Would be best for him to be on allopurinol but limited financially

## 2014-12-04 NOTE — Assessment & Plan Note (Signed)
Patient reports intimacy problems. He states he has difficulty getting erections. He thinks there may be a mental component, but also that his high blood pressure could be contributing. He would like to get his BP under control and see if that helps and then consider other options. He believes his erectile dysfunction is causing some problems between him and his wife. He is interested in pursuing medication if it does not improve but is limited financially.

## 2014-12-04 NOTE — Patient Instructions (Signed)
-   Stop taking hydrochlorothiazide. This medication could be causing your gout flares - Start taking benazepril 10 mg daily - Complete the stool cards and bring to your next visit. You need to take a sample from a separate stool for each card. Only one bowel movement per day per card.   General Instructions:   Please bring your medicines with you each time you come to clinic.  Medicines may include prescription medications, over-the-counter medications, herbal remedies, eye drops, vitamins, or other pills.   Progress Toward Treatment Goals:  Treatment Goal 11/03/2013  Blood pressure improved    Self Care Goals & Plans:  Self Care Goal 12/04/2014  Manage my medications take my medicines as prescribed; refill my medications on time  Monitor my health bring my blood pressure log to each visit; keep track of my blood pressure  Eat healthy foods eat foods that are low in salt; eat baked foods instead of fried foods  Be physically active find an activity I enjoy  Meeting treatment goals -    No flowsheet data found.   Care Management & Community Referrals:  Referral 11/03/2013  Referrals made for care management support none needed  Referrals made to community resources none

## 2014-12-04 NOTE — Progress Notes (Signed)
   Subjective:    Patient ID: Tyler Burch, male    DOB: 09/06/1960, 54 y.o.   MRN: [id number]  HPI Tyler Burch is a 54yo man with PMHx of HTN and gout who presents for a routine visit.  Please refer to A&P documentation.    Review of Systems General: Denies fever, chills, night sweats, changes in weight, changes in appetite HEENT: Denies headaches, ear pain, changes in vision, rhinorrhea, sore throat CV: Denies CP, palpitations, SOB, orthopnea Pulm: Denies SOB, cough, wheezing GI: Denies abdominal pain, nausea, vomiting, diarrhea, constipation, melena, hematochezia GU: Denies dysuria, hematuria, frequency Msk: Denies muscle cramps, joint pains Neuro: Denies weakness, numbness, tingling Skin: Reports eczema. Denies bruising    Objective:   Physical Exam General: alert, sitting up in chair, NAD HEENT: [place]/AT, EOMI, sclera anicteric, mucus membranes moist CV: RRR, no m/g/r Pulm: CTA bilaterally, breaths non-labored Abd: BS+, soft, non-tender Ext: warm, no peripheral edema Neuro: alert and oriented x 3, no focal deficits    Assessment & Plan:  Refer to A&P documentation.

## 2014-12-05 NOTE — Progress Notes (Signed)
Case discussed with Dr. Rivet at time of visit.  We reviewed the resident's history and exam and pertinent patient test results.  I agree with the assessment, diagnosis, and plan of care documented in the resident's note. 

## 2015-08-25 ENCOUNTER — Emergency Department (HOSPITAL_COMMUNITY): Payer: Self-pay

## 2015-08-25 ENCOUNTER — Observation Stay (HOSPITAL_COMMUNITY)
Admission: EM | Admit: 2015-08-25 | Discharge: 2015-08-28 | Disposition: A | Discharge status: Home / Self Care | Payer: Self-pay | Attending: Internal Medicine | Admitting: Internal Medicine

## 2015-08-25 ENCOUNTER — Encounter (HOSPITAL_COMMUNITY): Payer: Self-pay | Admitting: Emergency Medicine

## 2015-08-25 DIAGNOSIS — R7989 Other specified abnormal findings of blood chemistry: Secondary | ICD-10-CM | POA: Diagnosis present

## 2015-08-25 DIAGNOSIS — M109 Gout, unspecified: Secondary | ICD-10-CM | POA: Diagnosis present

## 2015-08-25 DIAGNOSIS — Z800 Family history of malignant neoplasm of digestive organs: Secondary | ICD-10-CM | POA: Insufficient documentation

## 2015-08-25 DIAGNOSIS — F1010 Alcohol abuse, uncomplicated: Secondary | ICD-10-CM | POA: Insufficient documentation

## 2015-08-25 DIAGNOSIS — I10 Essential (primary) hypertension: Secondary | ICD-10-CM | POA: Diagnosis present

## 2015-08-25 DIAGNOSIS — I119 Hypertensive heart disease without heart failure: Secondary | ICD-10-CM | POA: Diagnosis present

## 2015-08-25 DIAGNOSIS — Z9114 Patient's other noncompliance with medication regimen: Secondary | ICD-10-CM | POA: Insufficient documentation

## 2015-08-25 DIAGNOSIS — R748 Abnormal levels of other serum enzymes: Secondary | ICD-10-CM | POA: Insufficient documentation

## 2015-08-25 DIAGNOSIS — R079 Chest pain, unspecified: Secondary | ICD-10-CM | POA: Diagnosis present

## 2015-08-25 DIAGNOSIS — N179 Acute kidney failure, unspecified: Secondary | ICD-10-CM | POA: Insufficient documentation

## 2015-08-25 DIAGNOSIS — R072 Precordial pain: Principal | ICD-10-CM | POA: Insufficient documentation

## 2015-08-25 DIAGNOSIS — Z79899 Other long term (current) drug therapy: Secondary | ICD-10-CM | POA: Insufficient documentation

## 2015-08-25 DIAGNOSIS — R0602 Shortness of breath: Secondary | ICD-10-CM | POA: Insufficient documentation

## 2015-08-25 DIAGNOSIS — E785 Hyperlipidemia, unspecified: Secondary | ICD-10-CM | POA: Insufficient documentation

## 2015-08-25 DIAGNOSIS — D509 Iron deficiency anemia, unspecified: Secondary | ICD-10-CM | POA: Insufficient documentation

## 2015-08-25 LAB — BASIC METABOLIC PANEL
Anion gap: 14 (ref 5–15)
BUN: 25 mg/dL — AB (ref 6–20)
CO2: 19 mmol/L — ABNORMAL LOW (ref 22–32)
Calcium: 8.9 mg/dL (ref 8.9–10.3)
Chloride: 107 mmol/L (ref 101–111)
Creatinine, Ser: 1.49 mg/dL — ABNORMAL HIGH (ref 0.61–1.24)
GFR calc Af Amer: >60 mL/min (ref >60)
GFR, EST NON AFRICAN AMERICAN: 52 mL/min — AB (ref >60)
GLUCOSE: 87 mg/dL (ref 65–99)
POTASSIUM: 4.1 mmol/L (ref 3.5–5.1)
Sodium: 140 mmol/L (ref 135–145)

## 2015-08-25 LAB — CBC WITH DIFFERENTIAL/PLATELET
Basophils Absolute: 0 10*3/uL (ref 0.0–0.1)
Basophils Relative: 1 %
EOS ABS: 0.2 10*3/uL (ref 0.0–0.7)
EOS PCT: 4 %
HCT: 39.7 % (ref 39.0–52.0)
Hemoglobin: 12.7 g/dL — ABNORMAL LOW (ref 13.0–17.0)
LYMPHS ABS: 2.3 10*3/uL (ref 0.7–4.0)
LYMPHS PCT: 39 %
MCH: 26.2 pg (ref 26.0–34.0)
MCHC: 32 g/dL (ref 30.0–36.0)
MCV: 82 fL (ref 78.0–100.0)
MONO ABS: 0.5 10*3/uL (ref 0.1–1.0)
MONOS PCT: 8 %
Neutro Abs: 2.8 10*3/uL (ref 1.7–7.7)
Neutrophils Relative %: 48 %
PLATELETS: 222 10*3/uL (ref 150–400)
RBC: 4.84 MIL/uL (ref 4.22–5.81)
RDW: 15.8 % — ABNORMAL HIGH (ref 11.5–15.5)
WBC: 5.8 10*3/uL (ref 4.0–10.5)

## 2015-08-25 LAB — I-STAT TROPONIN, ED: Troponin i, poc: 0.02 ng/mL (ref 0.00–0.08)

## 2015-08-25 MED ORDER — SODIUM CHLORIDE 0.9 % IV BOLUS (SEPSIS)
1000.0000 mL | Freq: Once | INTRAVENOUS | Status: AC
  Administered 2015-08-26 – 2015-08-27 (×2): 1000 mL via INTRAVENOUS

## 2015-08-25 NOTE — ED Notes (Signed)
Pt arrives via EMS for chest pain onset this evening, states started while he was sitting and talking. Reports pain is substernal, non radiating, sharp in nature.. Pt anxious upon arrival to department, sclera bright red. Hx HTN, noncompliant with his medications. 324 MG aspirin and 1 SL nitro given by EMS.

## 2015-08-25 NOTE — ED Provider Notes (Signed)
CSN: [id number]     Arrival date & time 08/25/15  2212 History   First MD Initiated Contact with Patient 08/25/15 2222     Chief Complaint  Patient presents with  . Chest Pain     (Consider location/radiation/quality/duration/timing/severity/associated sxs/prior Treatment) Patient is a 55 y.o. male presenting with chest pain and general illness. The history is provided by the patient and a relative.  Chest Pain Associated symptoms: diaphoresis, nausea and shortness of breath   Associated symptoms: no abdominal pain, no back pain, no cough, no fatigue, no fever, no headache, no palpitations and not vomiting   Illness Location:  Chest pain Severity:  Moderate Onset quality:  Sudden Duration:  1 hour Timing:  Constant Progression:  Improving Chronicity:  New Context:  History of hypertension. Acute onset of chest pain, sharp, shortness of breath, diaphoresis, nausea. No fevers and chills. No infectious symptoms. No history of clots. Associated symptoms: chest pain, nausea and shortness of breath   Associated symptoms: no abdominal pain, no cough, no diarrhea, no fatigue, no fever, no headaches and no vomiting     Past Medical History  Diagnosis Date  . Hypertension   . Gout    History reviewed. No pertinent past surgical history. Family History  Problem Relation Age of Onset  . Colon cancer Sister    Social History  Substance Use Topics  . Smoking status: Never Smoker   . Smokeless tobacco: None  . Alcohol Use: Yes    Review of Systems  Constitutional: Positive for diaphoresis. Negative for fever, chills and fatigue.  Respiratory: Positive for shortness of breath. Negative for cough.   Cardiovascular: Positive for chest pain. Negative for palpitations and leg swelling.  Gastrointestinal: Positive for nausea. Negative for vomiting, abdominal pain and diarrhea.  Musculoskeletal: Negative for back pain.  Neurological: Negative for light-headedness and headaches.  All  other systems reviewed and are negative.     Allergies  Shellfish allergy and Lisinopril  Home Medications   Prior to Admission medications   Medication Sig Start Date End Date Taking? Authorizing Provider  benazepril (LOTENSIN) 10 MG tablet Take 1 tablet (10 mg total) by mouth daily. 12/04/14   Su Hoff, MD  naproxen sodium (ANAPROX) 220 MG tablet Take 220 mg by mouth 2 (two) times daily as needed (gout flare up).     Historical Provider, MD   BP 140/90 mmHg  Pulse 98  Temp(Src) 98.1 F (36.7 C) (Oral)  Resp 15  Ht 6' (1.829 m)  Wt 108.863 kg  BMI 32.54 kg/m2  SpO2 96% Physical Exam  Constitutional: He is oriented to person, place, and time. He appears well-developed and well-nourished. No distress.  HENT:  Head: Normocephalic and atraumatic.  Eyes: EOM are normal. Pupils are equal, round, and reactive to light.  Cardiovascular: Regular rhythm and intact distal pulses.  Tachycardia present.   Pulmonary/Chest: Effort normal. No tachypnea. No respiratory distress. He has no wheezes. He has no rhonchi.  Abdominal: Soft. There is no tenderness. There is no rebound, no guarding, no CVA tenderness, no tenderness at McBurney's point and negative Murphy's sign.  Musculoskeletal: He exhibits no edema.       Right lower leg: He exhibits no swelling and no edema.       Left lower leg: He exhibits no swelling and no edema.  Neurological: He is alert and oriented to person, place, and time.  Skin: Skin is warm and dry.    ED Course  Procedures (including critical care  time) Labs Review Labs Reviewed  CBC WITH DIFFERENTIAL/PLATELET - Abnormal; Notable for the following:    Hemoglobin 12.7 (*)    RDW 15.8 (*)    All other components within normal limits  BASIC METABOLIC PANEL - Abnormal; Notable for the following:    CO2 19 (*)    BUN 25 (*)    Creatinine, Ser 1.49 (*)    GFR calc non Af Amer 52 (*)    All other components within normal limits  Rosezena SensorI-STAT TROPOININ, ED     Imaging Review Dg Chest 2 View  08/25/2015  CLINICAL DATA:  55 year old male with chest pain EXAM: CHEST  2 VIEW COMPARISON:  Radiograph dated 11/11/2010 and 10/11/2010 FINDINGS: The heart size and mediastinal contours are within normal limits. Both lungs are clear. The visualized skeletal structures are unremarkable. IMPRESSION: No active cardiopulmonary disease. Electronically Signed   By: Elgie CollardArash  Radparvar M.D.   On: 08/25/2015 23:39   I have personally reviewed and evaluated these images and lab results as part of my medical decision-making.   EKG Interpretation   Date/Time:  Saturday Aug 25 2015 22:16:15 EDT Ventricular Rate:  102 PR Interval:  162 QRS Duration: 86 QT Interval:  378 QTC Calculation: 492 R Axis:   -165 Text Interpretation:  Sinus tachycardia Right superior axis deviation T  wave abnormality, consider anterior ischemia Abnormal ECG Confirmed by  ZAVITZ MD, Ivin BootyJOSHUA ([id number]) on 08/25/2015 10:25:48 PM      MDM   Final diagnoses:  Substernal chest pain    55 year old male with history of hypertension presenting with chest pain. Exertional, sharp, nonradiating, shortness of breath, nausea, diaphoresis. Improved after nitroglycerin. Received full dose aspirin with EMS. EKG here shows evidence of right axis deviation as well as T-wave abnormalities in the anterior as well as lateral leads. Unclear if there is possible lead placement issues; getting a repeat EKG at this time. HEAR score is 4. First troponin negative. Chest x-ray without evidence of pneumonia or pneumothorax. PESI 62; very low suspicion for pulmonary embolism. Symptoms not consistent with aortic dissection.  Patient has a fairly good story for ACS. We'll admit the patient to the internal medicine resident team for further intervention and evaluation.  Lindalou HoseSean O'Rourke, MD 08/26/15 [id number]  Blane OharaJoshua Zavitz, MD 08/26/15 [phone]

## 2015-08-26 ENCOUNTER — Encounter (HOSPITAL_COMMUNITY): Payer: Self-pay | Admitting: Adult Health

## 2015-08-26 DIAGNOSIS — I200 Unstable angina: Secondary | ICD-10-CM

## 2015-08-26 DIAGNOSIS — D509 Iron deficiency anemia, unspecified: Secondary | ICD-10-CM

## 2015-08-26 DIAGNOSIS — M109 Gout, unspecified: Secondary | ICD-10-CM

## 2015-08-26 DIAGNOSIS — N289 Disorder of kidney and ureter, unspecified: Secondary | ICD-10-CM

## 2015-08-26 DIAGNOSIS — R079 Chest pain, unspecified: Secondary | ICD-10-CM | POA: Diagnosis present

## 2015-08-26 DIAGNOSIS — R072 Precordial pain: Secondary | ICD-10-CM

## 2015-08-26 DIAGNOSIS — R7989 Other specified abnormal findings of blood chemistry: Secondary | ICD-10-CM | POA: Diagnosis present

## 2015-08-26 LAB — URINALYSIS, ROUTINE W REFLEX MICROSCOPIC
Bilirubin Urine: NEGATIVE
GLUCOSE, UA: NEGATIVE mg/dL
HGB URINE DIPSTICK: NEGATIVE
KETONES UR: NEGATIVE mg/dL
LEUKOCYTES UA: NEGATIVE
Nitrite: NEGATIVE
PROTEIN: NEGATIVE mg/dL
Specific Gravity, Urine: 1.02 (ref 1.005–1.030)
pH: 5 (ref 5.0–8.0)

## 2015-08-26 LAB — TROPONIN I
TROPONIN I: 0.04 ng/mL — AB (ref <0.031)
TROPONIN I: 0.04 ng/mL — AB (ref <0.031)
Troponin I: 0.04 ng/mL — ABNORMAL HIGH (ref <0.031)
Troponin I: 0.04 ng/mL — ABNORMAL HIGH (ref <0.031)
Troponin I: 0.03 ng/mL (ref <0.031)

## 2015-08-26 LAB — COMPREHENSIVE METABOLIC PANEL
ALBUMIN: 3.7 g/dL (ref 3.5–5.0)
ALK PHOS: 86 U/L (ref 38–126)
ALT: 41 U/L (ref 17–63)
ANION GAP: 11 (ref 5–15)
AST: 50 U/L — ABNORMAL HIGH (ref 15–41)
BILIRUBIN TOTAL: 1.1 mg/dL (ref 0.3–1.2)
BUN: 20 mg/dL (ref 6–20)
CALCIUM: 8.7 mg/dL — AB (ref 8.9–10.3)
CO2: 21 mmol/L — ABNORMAL LOW (ref 22–32)
Chloride: 108 mmol/L (ref 101–111)
Creatinine, Ser: 1.13 mg/dL (ref 0.61–1.24)
GFR calc non Af Amer: >60 mL/min (ref >60)
Glucose, Bld: 71 mg/dL (ref 65–99)
POTASSIUM: 4.2 mmol/L (ref 3.5–5.1)
SODIUM: 140 mmol/L (ref 135–145)
TOTAL PROTEIN: 7.2 g/dL (ref 6.5–8.1)

## 2015-08-26 LAB — RAPID URINE DRUG SCREEN, HOSP PERFORMED
Amphetamines: NOT DETECTED
Barbiturates: NOT DETECTED
Benzodiazepines: NOT DETECTED
COCAINE: NOT DETECTED
Opiates: NOT DETECTED
TETRAHYDROCANNABINOL: NOT DETECTED

## 2015-08-26 LAB — MAGNESIUM: Magnesium: 2.2 mg/dL (ref 1.7–2.4)

## 2015-08-26 LAB — LIPID PANEL
CHOL/HDL RATIO: 3.6 ratio
Cholesterol: 189 mg/dL (ref 0–200)
HDL: 53 mg/dL (ref >40)
LDL Cholesterol: 88 mg/dL (ref 0–99)
TRIGLYCERIDES: 242 mg/dL — AB (ref <150)
VLDL: 48 mg/dL — ABNORMAL HIGH (ref 0–40)

## 2015-08-26 LAB — PROTIME-INR
INR: 1.06 (ref 0.00–1.49)
PROTHROMBIN TIME: 14 s (ref 11.6–15.2)

## 2015-08-26 LAB — HEPARIN LEVEL (UNFRACTIONATED): Heparin Unfractionated: 0.21 IU/mL — ABNORMAL LOW (ref 0.30–0.70)

## 2015-08-26 MED ORDER — HEPARIN (PORCINE) IN NACL 100-0.45 UNIT/ML-% IJ SOLN
1400.0000 [IU]/h | INTRAMUSCULAR | Status: DC
  Administered 2015-08-26: 1600 [IU]/h via INTRAVENOUS
  Filled 2015-08-26 (×2): qty 250

## 2015-08-26 MED ORDER — ASPIRIN EC 325 MG PO TBEC
325.0000 mg | DELAYED_RELEASE_TABLET | Freq: Once | ORAL | Status: AC
  Administered 2015-08-26: 325 mg via ORAL
  Filled 2015-08-26: qty 1

## 2015-08-26 MED ORDER — HEPARIN (PORCINE) IN NACL 100-0.45 UNIT/ML-% IJ SOLN
1400.0000 [IU]/h | INTRAMUSCULAR | Status: DC
  Administered 2015-08-26: 1400 [IU]/h via INTRAVENOUS
  Filled 2015-08-26: qty 250

## 2015-08-26 MED ORDER — HEPARIN BOLUS VIA INFUSION
4000.0000 [IU] | Freq: Once | INTRAVENOUS | Status: AC
  Administered 2015-08-26: 4000 [IU] via INTRAVENOUS
  Filled 2015-08-26: qty 4000

## 2015-08-26 MED ORDER — SODIUM CHLORIDE 0.9% FLUSH
3.0000 mL | Freq: Two times a day (BID) | INTRAVENOUS | Status: DC
  Administered 2015-08-26 – 2015-08-27 (×2): 3 mL via INTRAVENOUS

## 2015-08-26 MED ORDER — NITROGLYCERIN 0.4 MG SL SUBL
SUBLINGUAL_TABLET | SUBLINGUAL | Status: AC
  Filled 2015-08-26: qty 1

## 2015-08-26 MED ORDER — LABETALOL HCL 5 MG/ML IV SOLN
10.0000 mg | INTRAVENOUS | Status: AC | PRN
  Administered 2015-08-27 – 2015-08-28 (×4): 10 mg via INTRAVENOUS
  Filled 2015-08-26 (×3): qty 4

## 2015-08-26 MED ORDER — ATORVASTATIN CALCIUM 80 MG PO TABS
80.0000 mg | ORAL_TABLET | Freq: Every day | ORAL | Status: DC
  Administered 2015-08-26 – 2015-08-27 (×2): 80 mg via ORAL
  Filled 2015-08-26 (×2): qty 1

## 2015-08-26 MED ORDER — ACETAMINOPHEN 650 MG RE SUPP: 650.0000 mg | Freq: Four times a day (QID) | RECTAL | Status: DC | PRN

## 2015-08-26 MED ORDER — ENOXAPARIN SODIUM 40 MG/0.4ML ~~LOC~~ SOLN
40.0000 mg | Freq: Every day | SUBCUTANEOUS | Status: DC
  Filled 2015-08-26: qty 0.4

## 2015-08-26 MED ORDER — ASPIRIN 81 MG PO CHEW
81.0000 mg | CHEWABLE_TABLET | Freq: Every day | ORAL | Status: DC
  Administered 2015-08-26 – 2015-08-28 (×3): 81 mg via ORAL
  Filled 2015-08-26 (×3): qty 1

## 2015-08-26 MED ORDER — METOPROLOL SUCCINATE ER 25 MG PO TB24
25.0000 mg | ORAL_TABLET | Freq: Every day | ORAL | Status: DC
  Administered 2015-08-26 – 2015-08-28 (×3): 25 mg via ORAL
  Filled 2015-08-26 (×3): qty 1

## 2015-08-26 MED ORDER — SODIUM CHLORIDE 0.9 % WEIGHT BASED INFUSION
1.0000 mL/kg/h | INTRAVENOUS | Status: DC
  Administered 2015-08-26 – 2015-08-27 (×3): 1 mL/kg/h via INTRAVENOUS

## 2015-08-26 MED ORDER — SODIUM CHLORIDE 0.9 % IV SOLN: 250.0000 mL | INTRAVENOUS | Status: DC | PRN

## 2015-08-26 MED ORDER — ACETAMINOPHEN 325 MG PO TABS
650.0000 mg | ORAL_TABLET | Freq: Four times a day (QID) | ORAL | Status: DC | PRN
  Administered 2015-08-27: 650 mg via ORAL
  Filled 2015-08-26: qty 2

## 2015-08-26 MED ORDER — NITROGLYCERIN 0.4 MG SL SUBL
0.4000 mg | SUBLINGUAL_TABLET | SUBLINGUAL | Status: DC | PRN
  Administered 2015-08-26 (×2): 0.4 mg via SUBLINGUAL
  Filled 2015-08-26: qty 1

## 2015-08-26 MED ORDER — SODIUM CHLORIDE 0.9% FLUSH: 3.0000 mL | INTRAVENOUS | Status: DC | PRN

## 2015-08-26 MED ORDER — SODIUM CHLORIDE 0.9% FLUSH
3.0000 mL | Freq: Two times a day (BID) | INTRAVENOUS | Status: DC
  Administered 2015-08-26 – 2015-08-28 (×4): 3 mL via INTRAVENOUS

## 2015-08-26 MED ORDER — HEPARIN BOLUS VIA INFUSION
2000.0000 [IU] | Freq: Once | INTRAVENOUS | Status: AC
  Administered 2015-08-26: 2000 [IU] via INTRAVENOUS
  Filled 2015-08-26: qty 2000

## 2015-08-26 NOTE — Progress Notes (Signed)
Subjective: Patient seen and examined this morning on rounds.  No acute events overnight since admission.  Did require NTG once last night with relief of chest discomfort.  No chest pain currently this morning.  Objective: Vital signs in last 24 hours: Filed Vitals:   08/26/15 0045 08/26/15 0100 08/26/15 0202 08/26/15 0615  BP: 121/86 138/90 132/84 131/86  Pulse: 96 92 89 84  Temp:   98 F (36.7 C) 98.2 F (36.8 C)  TempSrc:   Oral Oral  Resp: Height:    (1.854 m)   Weight:   231 lb 7.7 oz (105 kg)   SpO2: 95% 95% 98% 98%   Weight change:  No intake or output data in the 24 hours ending 08/26/15 1159 General: resting in bed, no distress HEENT: EOMI, no scleral icterus Cardiac: RRR, no rubs, murmurs or gallops.  Distal pulses intact. Pulm: clear to auscultation bilaterally, moving normal volumes of air Abd: soft, nontender, nondistended, BS present Ext: warm and well perfused, no pedal edema Neuro: alert and oriented X3, cranial nerves II-XII grossly intact  Lab Results: Basic Metabolic Panel:  Recent Labs Lab 08/25/15 2230 08/26/15 0926  NA 140 140  K 4.1 4.2  CL 107 108  CO2 19* 21*  GLUCOSE 87 71  BUN 25* 20  CREATININE 1.49* 1.13  CALCIUM 8.9 8.7*   Liver Function Tests:  Recent Labs Lab 08/26/15 0926  AST 50*  ALT 41  ALKPHOS 86  BILITOT 1.1  PROT 7.2  ALBUMIN 3.7   No results for input(s): LIPASE, AMYLASE in the last 168 hours. No results for input(s): AMMONIA in the last 168 hours. CBC:  Recent Labs Lab 08/25/15 2230  WBC 5.8  NEUTROABS 2.8  HGB 12.7*  HCT 39.7  MCV 82.0  PLT 222   Cardiac Enzymes:  Recent Labs Lab 08/26/15 0248 08/26/15 0926  TROPONINI 0.04* 0.04*   BNP: No results for input(s): PROBNP in the last 168 hours. D-Dimer: No results for input(s): DDIMER in the last 168 hours. CBG: No results for input(s): GLUCAP in the last 168 hours. Hemoglobin A1C: No results for input(s): HGBA1C in the  last 168 hours. Fasting Lipid Panel:  Recent Labs Lab 08/26/15 0248  CHOL 189  HDL 53  LDLCALC 88  TRIG 242*  CHOLHDL 3.6    Micro Results: No results found for this or any previous visit (from the past 240 hour(s)). Studies/Results: Dg Chest 2 View  08/25/2015  CLINICAL DATA:  55 year old male with chest pain EXAM: CHEST  2 VIEW COMPARISON:  Radiograph dated 11/11/2010 and 10/11/2010 FINDINGS: The heart size and mediastinal contours are within normal limits. Both lungs are clear. The visualized skeletal structures are unremarkable. IMPRESSION: No active cardiopulmonary disease. Electronically Signed   By: Elgie Collard M.D.   On: 08/25/2015 23:39   Medications: I have reviewed the patient's current medications. Scheduled Meds: . aspirin  81 mg Oral Daily  . atorvastatin  80 mg Oral q1800  . heparin  4,000 Units Intravenous Once  . metoprolol succinate  25 mg Oral Daily  . nitroGLYCERIN      . sodium chloride flush  3 mL Intravenous Q12H  . sodium chloride flush  3 mL Intravenous Q12H   Continuous Infusions: . sodium chloride 1 mL/kg/hr (08/26/15 1045)  . heparin     PRN Meds:.sodium chloride, acetaminophen **OR** acetaminophen, nitroGLYCERIN, sodium chloride flush Assessment/Plan: Active Problems:   Essential hypertension   Gout  Substernal chest pain   Elevated serum creatinine   Chest pain  Chest Pain: patient with both typical and atypical features of his chest pain.  It is associated with exertion and hypertension and reportedly ongoing symptoms now actually for approximately 1 month.  Associated diaphoresis, dyspnea, and lightheadedness.  No previous cardiac history.  EKG here with evidence of LVH, prolonged QTc.  No cardiomegaly on CXR.  Suspicious for unstable angina vs NSTEMI. - cardiology assistance appreciated - Echo today, left heart cath tomorrow with Dr. Shirlee LatchMcLean - continue cycling troponin - heparin gtt - add atorvastatin 80mg  - add metoprolol  succinate 25mg  daily - UDS needs to be collected - sublingual NTG prn  HTN: he tells us this morning he has not been taking his lisinopril for a year and only restarted it yesterday.  Creatine is mildly elevated to 1.5.  Blood pressure is normotensive here without intervention - chest pain work-up as above - consider losartan for uricosuric benefits given his history of gout  AKI: back to 1.1 this morning.  His creatine was slightly elevated at 1.5 at admission.  It is unclear what his baseline is, previously 1.54 in August 2016, but normal prior to that. Previous history of ureteral stone leading to hematuria was potentially responsible for previous elevated creatine - holding lisinopril  - UA  History of Alcohol Abuse: reportedly a heavy beer drinker in the past but has cut back in the last few months.  Mild AST elevation on CMET - watch out for withdrawal  History of Chronic Gout: reportedly uses NSAIDs and colchicine for this  - check uric acid level while not having acute flare - outpatient follow up.  May benefit from prophylactic treatment.  Diet: heart healthy, NPO at midnight  DVT PPx: Lovenox  Code: FULL  Dispo: Disposition is deferred at this time, awaiting improvement of current medical problems.  Anticipated discharge in approximately 2-3 day(s).   The patient does not have a current PCP (Pcp Not In System) and [facility] after [facility].  The patient does not have transportation limitations that hinder transportation to clinic appointments.  .Services Needed at time of discharge: Y = Yes, Blank = No PT:   OT:   RN:   Equipment:   Other:       Gwynn BurlyAndrew Eyva Califano, DO 08/26/2015, 11:59 AM

## 2015-08-26 NOTE — Consult Note (Signed)
CARDIOLOGY CONSULT NOTE   Patient ID: Tyler Burch MRN: [id number] DOB/AGE: 53-Oct-1962 53 y.o.  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 The patient sat down to rest and continued to have discomfort, tried to stand up and became dizzy. Due to continued symptoms lasting over 10 minutes, EMS was called. The patient then received one nitroglycerin sublingual by EMS responders and discomfort subsided. He did have one additional episode of chest pain last evening after being admitted to the room and was given one sublingual nitroglycerin with relief.  On further discussion, the patient has been having symptoms for about a month with racing heart rate chest discomfort on the right side with diaphoresis and dyspnea with exertion. He has not been compliant with antihypertensive medication lisinopril for greater than one year. He has frequent flareups of gout for which she takes colchicine. He admits to heavy alcohol use in the past, 40 ounce beers daily sometimes more on the weekend. But he has cut back over the last few months.  On arrival to the emergency room blood pressure 140/90 heart rate 98, O2 sat 96% he was afebrile. Initial troponin 0.02, increasing to 0.04. Pertinent labs reveal creatinine of 1.49, BUN of 25. EKG revealed sinus rhythm, prolonged QT of 434,QTc 500, interval  with T-wave flattening inferior laterally, LVH. He was treated with IV  fluids and NTG in ER. We are asked for cardiology recommendations.    Allergies  Allergen Reactions  . Shellfish Allergy Other (See Comments)    Causes Gout to flareup    Medications Scheduled Medications: . aspirin  81 mg Oral Daily  . enoxaparin (LOVENOX) injection  40 mg Subcutaneous Daily  . nitroGLYCERIN      . sodium chloride flush  3 mL Intravenous Q12H      PRN Medications: acetaminophen **OR** acetaminophen, nitroGLYCERIN   Past Medical History  Diagnosis Date  . Hypertension   . Gout     History reviewed. No pertinent past surgical history.  Family History  Problem Relation Age of Onset  . Colon cancer Sister     Social History Tyler Burch reports that he has never smoked. He does not have any smokeless tobacco history on file. Tyler Burch reports that he drinks alcohol.  Review of Systems Complete review of systems are found to be negative unless outlined in H&P above.  Physical Examination Blood pressure 131/86, pulse 84, temperature 98.2 F (36.8 C), temperature source Oral, resp. rate 18, height 6\' 1"  (1.854 m), weight 231 lb 7.7 oz (105 kg), SpO2 98 %. No intake or output data in the 24 hours ending 08/26/15 1004  Telemetry: NSR with PVC's. Rates in the 80's.   GEN:No acute distress  HEENT: Conjunctiva and lids normal, oropharynx clear with moist mucosa. Neck: Supple, no elevated JVP or carotid bruits, no thyromegaly. Lungs: Clear to auscultation, nonlabored breathing at rest. Cardiac: Regular rate and rhythm, soft S4, no pericardial rub. Abdomen: Soft, nontender, no hepatomegaly, bowel sounds present, no guarding or rebound.  Extremities: No pitting edema, distal pulses 2+. Skin: Warm and dry. Musculoskeletal: No kyphosis. Neuropsychiatric: Alert and oriented x3, affect grossly appropriate.  Prior Cardiac Testing/Procedures None  Lab Results  Basic Metabolic Panel:  Recent Labs Lab 08/25/15 2230  NA 140  K 4.1  CL 107  CO2 19*   GLUCOSE 87  BUN 25*  CREATININE 1.49*  CALCIUM 8.9    CBC:  Recent Labs Lab 08/25/15 2230  WBC 5.8  NEUTROABS 2.8  HGB 12.7*  HCT 39.7  MCV 82.0  PLT 222    Cardiac Enzymes:  Recent Labs Lab 08/26/15 0248  TROPONINI 0.04*    Radiology: Dg Chest 2 View  08/25/2015  CLINICAL DATA:  55 year old male with chest pain EXAM: CHEST  2 VIEW COMPARISON:  Radiograph dated 11/11/2010 and 10/11/2010 FINDINGS: The heart size and mediastinal contours are within normal limits. Both lungs are clear. The visualized skeletal structures are unremarkable. IMPRESSION: No active cardiopulmonary disease. Electronically Signed   By: Elgie Collard M.D.   On: 08/25/2015 23:39     [id number] rhythm with LVH, prolonged QTc, with T-wave flattening noted inferior laterally.   Impression and Recommendations  1. Chest Pain: Typical and atypical features associated with exertion and hypertension. The patient has been having ongoing symptoms for approximately one month with associated diaphoresis dyspnea and dizziness. Patient has had 1 episode of discomfort since admission relieved with nitroglycerin.      He has not be seen by cardiology in the past and does have evidence of LVH on EKG with prolonged QT interval. Chest x-ray did not show cardiomegaly. We'll plan echocardiogram for LV systolic function. If no significant decrease in LVEF, would recommend cardiac cath. Continue to cycle cardiac enzymes. Will start heparin and statin.  2. Hypertension: History of hypertension with medical noncompliance with medication for greater than one year. Creatinine is slightly elevated at 1.5. Is currently on lisinopril 10 mg daily at home. Blood pressure has normalized since admission without antihypertensives on board currently. Plan echocardiogram.   3. Alcohol abuse: Heavy beer drinker but has cut down over the last couple of months.  4. Chronic gout: Occasional flare-up's.  For which he takes Anaprox.     Signed: Bettey Mare. Lawrence NP [facility]  08/26/2015, 10:04 AM  Patient seen with NP, agree with the above note.  Mr Doswell has history of HTN, no smoking.  Parents died in accident when he was 5.  He has had several episodes of chest tightness starting on right and radiating across to the left.  This occurs with exertion.  Had prolonged episode yesterday while taking down a tent.  Pain was relieved by NTG, recurred, and was relieved by NTG again.  TnI borderline elevated at 0.04.  No chest pain currently.   Suspect unstable angina (vs NSTEMI, continue to cycle troponin).  Would start heparin gtt, continue ASA.  Atorvastatin 80 daily.  Add Toprol XL 25 daily.  Plan left heart cath tomorrow.  Risks/benefits of procedure discussed with patient, he agreed with cath tomorrow.    History of ETOH => watch for withdrawal.  Check Mg with long QT on ECG.   Marca Ancona 08/26/2015 10:25 AM

## 2015-08-26 NOTE — Progress Notes (Signed)
ANTICOAGULATION CONSULT NOTE - Initial Consult  Pharmacy Consult for heparin Indication: chest pain/ACS  Allergies  Allergen Reactions  . Shellfish Allergy Other (See Comments)    Causes Gout to flareup    Patient Measurements: Height: 6\' 1"  (185.4 cm) Weight: 231 lb 7.7 oz (105 kg) IBW/kg (Calculated) : 79.9 Heparin Dosing Weight: 101kg  Vital Signs: Temp: 98.2 F (36.8 C) (05/14 0615) Temp Source: Oral (05/14 0615) BP: 131/86 mmHg (05/14 0615) Pulse Rate: 84 (05/14 0615)  Labs:  Recent Labs  08/25/15 2230 08/26/15 0248 08/26/15 0926  HGB 12.7*  --   --   HCT 39.7  --   --   PLT 222  --   --   CREATININE 1.49*  --  1.13  TROPONINI  --  0.04* 0.04*    Estimated Creatinine Clearance: 96.1 mL/min (by C-G formula based on Cr of 1.13).  Assessment: [age] YOM with no prior cardiac history other than hypertension who presents with right sided chest pain radiating to his shoulder associate with diaphoresis, dizziness, and dyspnea.  To start IV heparin for ACS. Initial troponins slightly positive. He is not on anticoagulation PTA.  Baseline hgb 12.7, plts 222- no bleeding noted.  Goal of Therapy:  Heparin level 0.3-0.7 units/ml Monitor platelets by anticoagulation protocol: Yes   Plan:  -heparin bolus with 4000 units IV x1, then start infusion at 1400 units/hr -heparin level in 6h -daily HL and CBC -follow cardiology plans for cath  Jaskiran Pata D. Lakea Mittelman, PharmD, BCPS Clinical Pharmacist Pager: [phone] 08/26/2015 10:49 AM

## 2015-08-26 NOTE — H&P (Signed)
Date: 08/26/2015               Patient Name:  Tyler Burch MRN: [id number]  DOB: [birth year] Age / Sex: 55 y.o., male   PCP: Pcp Not In System         Medical Service: Internal Medicine Teaching Service         Attending Physician: Dr. Levert Feinstein, MD    First Contact: Dr. Gwynn Burly Pager: [phone]  Second Contact: Dr. Hyacinth Meeker Pager: [phone]       After Hours (After 5p/  First Contact Pager: [phone]  weekends / holidays): Second Contact Pager: [phone]   Chief Complaint: Chest pain  History of Present Illness:  Mr. Wilhelm Ganaway is a 55 year old male with PMH of HTN and Gout who presents with new-onset chest pain. He was at a family member's outdoor barbecue gathering when he began to feel a sharp, pressure-like chest pain that felt deep to his right chest wall and moved across to his left chest. This occurred around 6 pm while he was walking. He states the pain was 5-6/10 in intensity. The pain did not radiate to his arms, neck, jaw, or back. He felt diaphoretic, hot, and short of breath during this time. He reports associated intermittent mild headaches, but no change in vision, slurred speech, facial droop, focal weakness, loss of consciousness, N/V/D/C, or myalgias. He says he found some relief of his symptoms with rest. EMS were called and he was given a full dose Aspirin and nitrogylcerin with further improvement of his pain to a 2/10. 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  Social Hx: denies history of tobacco use, drinks 1-2 beers a day (had 2 beers prior to his chest pain), denies recreational drug use. Works as a Museum/gallery exhibitions officer.  Family Hx: Sister died age 33 from colon cancer   Meds: Current Facility-Administered Medications  Medication Dose Route Frequency Provider Last Rate Last Dose  . acetaminophen (TYLENOL) tablet 650 mg  650 mg Oral Q6H PRN Ejiroghene Wendall Stade, MD       Or  . acetaminophen  (TYLENOL) suppository 650 mg  650 mg Rectal Q6H PRN Ejiroghene E Emokpae, MD      . aspirin chewable tablet 81 mg  81 mg Oral Daily Ejiroghene E Emokpae, MD      . enoxaparin (LOVENOX) injection 40 mg  40 mg Subcutaneous Daily Ejiroghene E Emokpae, MD      . nitroGLYCERIN (NITROSTAT) 0.4 MG SL tablet           . nitroGLYCERIN (NITROSTAT) SL tablet 0.4 mg  0.4 mg Sublingual Q5 min PRN Ejiroghene Wendall Stade, MD   0.4 mg at 08/26/15 0253  . sodium chloride flush (NS) 0.9 % injection 3 mL  3 mL Intravenous Q12H Ejiroghene Wendall Stade, MD   3 mL at 08/26/15 0254    Allergies: Allergies as of 08/25/2015 - Review Complete 08/25/2015  Allergen Reaction Noted  . Shellfish allergy Other (See Comments) 12/11/2011  . Lisinopril  11/03/2013   Past Medical History  Diagnosis Date  . Hypertension   . Gout    History reviewed. No pertinent past surgical history. Family History  Problem Relation Age of Onset  . Colon cancer Sister    Social History   Social History  . Marital Status: Married    Spouse Name: N/A  . Number of Children: N/A  .  Years of Education: N/A   Occupational History  . Not on file.   Social History Main Topics  . Smoking status: Never Smoker   . Smokeless tobacco: Not on file  . Alcohol Use: Yes  . Drug Use: No  . Sexual Activity: Not on file   Other Topics Concern  . Not on file   Social History Narrative    Review of Systems: Review of Systems  Constitutional: Positive for diaphoresis. Negative for fever, chills, weight loss and malaise/fatigue.  HENT: Negative for congestion, ear pain and sore throat.   Eyes: Negative for blurred vision, double vision and pain.  Respiratory: Positive for shortness of breath. Negative for cough, hemoptysis, sputum production and wheezing.   Cardiovascular: Positive for chest pain. Negative for palpitations, orthopnea, claudication, leg swelling and PND.  Gastrointestinal: Negative for nausea, vomiting, abdominal pain,  diarrhea, constipation, blood in stool and melena.  Genitourinary: Negative for dysuria and hematuria.  Musculoskeletal: Negative for myalgias, back pain and joint pain.  Skin: Positive for rash.  Neurological: Positive for headaches. Negative for dizziness, sensory change, focal weakness, loss of consciousness and weakness.  Psychiatric/Behavioral: Negative for substance abuse.     Physical Exam: Blood pressure 132/84, pulse 89, temperature 98 F (36.7 C), temperature source Oral, resp. rate 18, height 6\' 1"  (1.854 m), weight 231 lb 7.7 oz (105 kg), SpO2 98 %. Physical Exam  Constitutional: He is oriented to person, place, and time. He appears well-developed and well-nourished. No distress.  HENT:  Head: Normocephalic and atraumatic.  Mouth/Throat: Oropharynx is clear and moist.  Eyes: EOM are normal. Pupils are equal, round, and reactive to light.  Neck: Normal range of motion. Neck supple.  Cardiovascular: Normal rate, regular rhythm and intact distal pulses.   No murmur heard. Pulmonary/Chest: Effort normal and breath sounds normal. No respiratory distress. He has no wheezes. He has no rales. He exhibits no tenderness.  Abdominal: Soft. Bowel sounds are normal.  Mild epigastric tenderness to palpation  Musculoskeletal: Normal range of motion. He exhibits no edema.  mild lower back/CVA tenderness to palpation   Lymphadenopathy:    He has no cervical adenopathy.  Neurological: He is alert and oriented to person, place, and time.  Skin: Skin is warm and dry. He is not diaphoretic.  Eczematous/atopic rash left wrist and lower abdomen  Psychiatric: He has a normal mood and affect.     Lab results: Basic Metabolic Panel:  Recent Labs  44/04/203/13/17 2230  NA 140  K 4.1  CL 107  CO2 19*  GLUCOSE 87  BUN 25*  CREATININE 1.49*  CALCIUM 8.9   Liver Function Tests: No results for input(s): AST, ALT, ALKPHOS, BILITOT, PROT, ALBUMIN in the last 72 hours. No results for input(s):  LIPASE, AMYLASE in the last 72 hours. No results for input(s): AMMONIA in the last 72 hours. CBC:  Recent Labs  08/25/15 2230  WBC 5.8  NEUTROABS 2.8  HGB 12.7*  HCT 39.7  MCV 82.0  PLT 222   Cardiac Enzymes: No results for input(s): CKTOTAL, CKMB, CKMBINDEX, TROPONINI in the last 72 hours. BNP: No results for input(s): PROBNP in the last 72 hours. D-Dimer: No results for input(s): DDIMER in the last 72 hours. CBG: No results for input(s): GLUCAP in the last 72 hours. Hemoglobin A1C: No results for input(s): HGBA1C in the last 72 hours. Fasting Lipid Panel: No results for input(s): CHOL, HDL, LDLCALC, TRIG, CHOLHDL, LDLDIRECT in the last 72 hours. Thyroid Function Tests: No results  for input(s): TSH, T4TOTAL, FREET4, T3FREE, THYROIDAB in the last 72 hours. Anemia Panel: No results for input(s): VITAMINB12, FOLATE, FERRITIN, TIBC, IRON, RETICCTPCT in the last 72 hours. Coagulation: No results for input(s): LABPROT, INR in the last 72 hours. Urine Drug Screen: Drugs of Abuse  No results found for: LABOPIA, COCAINSCRNUR, LABBENZ, AMPHETMU, THCU, LABBARB  Alcohol Level: No results for input(s): ETH in the last 72 hours. Urinalysis: No results for input(s): COLORURINE, LABSPEC, PHURINE, GLUCOSEU, HGBUR, BILIRUBINUR, KETONESUR, PROTEINUR, UROBILINOGEN, NITRITE, LEUKOCYTESUR in the last 72 hours.  Invalid input(s): APPERANCEUR   Imaging results:  Dg Chest 2 View  08/25/2015  CLINICAL DATA:  55 year old male with chest pain EXAM: CHEST  2 VIEW COMPARISON:  Radiograph dated 11/11/2010 and 10/11/2010 FINDINGS: The heart size and mediastinal contours are within normal limits. Both lungs are clear. The visualized skeletal structures are unremarkable. IMPRESSION: No active cardiopulmonary disease. Electronically Signed   By: Elgie Collard M.D.   On: 08/25/2015 23:39    Other results: EKG: Sinus tachycardia, rate 102, t-wave inversions at I, aVL, V2-3, RAD --> limb placement  error Repeat shows NSR and normal axis, inverted T-waves at III and V2-3, prolonged QT  Assessment & Plan by Problem: Active Problems:   Essential hypertension   Substernal chest pain   Elevated serum creatinine   Chest pain  Chest Pain: 55 year old male with history of HTN with sharp, pressure-like exertional right and left sided chest pain without radiation that is relieved with rest and sublingual nitroglycerin. Initial EKG with inverted T-waves and RAD, however this was likely a lead placement issue as repeat EKG no longer shows RAD. There is no prior EKG on file for comparison. CXR is without evidence for pulmonary infiltrate, consolidation, or pneumothorax. There is concern for ACS, for which patient will be admitted to rule out acute cardiac process. He may be a candidate for stress test to further evaluate his chest pain. Low suspicion for PE.  -Trend troponins -Repeat EKG -NPO for now for possible stress test -Check Lipid panel for risk stratification -Aspirin 81 mg -Sublingual nitroglycerin prn  HTN: Unclear if patient has been taking home BP meds as history is inconsistent. He was switched from Lisinopril to Benazepril in August 2016, however wife reports that he is taking Lisinopril still. -Hold Lisinopril while SCr elevated and patient normotensive   Elevated serum creatinine: SCr slightly elevated at 1.49. It is unclear what his baseline is, previously 1.54 in August 2016, but normal prior to that. He does take Naproxen as needed for gout flare ups and an Ace-I for HTN which can contribute to this. He had hematuria 2/2 a ureteral stone last year, which may have been the reason for his previously elevated SCr. He received 1L NS bolus in the ED. Will follow up SCr for improvement and check UA for continued hematuria. -Hold Lisinopril for now -CMP -UA  Diet: NPO for now  DVT ppx: Lovenox  Code: FULL   Dispo: Disposition is deferred at this time, awaiting improvement of  current medical problems. Anticipated discharge in approximately 1-2 day(s).   The patient does have a current PCP (Pcp Not In System) and does need an [facility] hospital follow-up appointment after discharge.  The patient does not have transportation limitations that hinder transportation to clinic appointments.  Signed: Darreld Mclean, MD 08/26/2015, 3:32 AM

## 2015-08-26 NOTE — Progress Notes (Signed)
ANTICOAGULATION CONSULT NOTE - FOLLOW UP    HL = 0.21 (goal 0.3 - 0.7 units/mL) Heparin dosing weight = 101 kg   Assessment: 55 YOM continues on IV heparin for ACS.  Heparin level is sub-therapeutic; no bleeding nor issue with infusion reported.   Plan: - Rebolus with 2000 units IV bolus x 1,  - Increase heparin gtt to 1600 units/hr - Check 6 hr HL    Lloyde Ludlam D. Laney Potashang, PharmD, BCPS 08/26/2015, 6:56 PM

## 2015-08-27 ENCOUNTER — Encounter (HOSPITAL_COMMUNITY)
Admission: EM | Disposition: A | Discharge status: Home / Self Care | Payer: Self-pay | Source: Home / Self Care | Attending: Emergency Medicine

## 2015-08-27 ENCOUNTER — Observation Stay (HOSPITAL_BASED_OUTPATIENT_CLINIC_OR_DEPARTMENT_OTHER): Payer: MEDICAID

## 2015-08-27 DIAGNOSIS — I119 Hypertensive heart disease without heart failure: Secondary | ICD-10-CM | POA: Diagnosis present

## 2015-08-27 DIAGNOSIS — F1020 Alcohol dependence, uncomplicated: Secondary | ICD-10-CM

## 2015-08-27 DIAGNOSIS — Z9119 Patient's noncompliance with other medical treatment and regimen: Secondary | ICD-10-CM

## 2015-08-27 DIAGNOSIS — R079 Chest pain, unspecified: Secondary | ICD-10-CM

## 2015-08-27 DIAGNOSIS — R748 Abnormal levels of other serum enzymes: Secondary | ICD-10-CM

## 2015-08-27 DIAGNOSIS — I10 Essential (primary) hypertension: Secondary | ICD-10-CM

## 2015-08-27 DIAGNOSIS — R0789 Other chest pain: Secondary | ICD-10-CM

## 2015-08-27 DIAGNOSIS — N179 Acute kidney failure, unspecified: Secondary | ICD-10-CM

## 2015-08-27 HISTORY — PX: CARDIAC CATHETERIZATION: SHX172

## 2015-08-27 LAB — BASIC METABOLIC PANEL
ANION GAP: 6 (ref 5–15)
BUN: 21 mg/dL — ABNORMAL HIGH (ref 6–20)
CALCIUM: 8.4 mg/dL — AB (ref 8.9–10.3)
CO2: 24 mmol/L (ref 22–32)
CREATININE: 1.33 mg/dL — AB (ref 0.61–1.24)
Chloride: 105 mmol/L (ref 101–111)
GFR, EST NON AFRICAN AMERICAN: 60 mL/min — AB (ref >60)
Glucose, Bld: 94 mg/dL (ref 65–99)
Potassium: 3.9 mmol/L (ref 3.5–5.1)
SODIUM: 135 mmol/L (ref 135–145)

## 2015-08-27 LAB — HEPARIN LEVEL (UNFRACTIONATED)
HEPARIN UNFRACTIONATED: 0.78 [IU]/mL — AB (ref 0.30–0.70)
HEPARIN UNFRACTIONATED: 0.97 [IU]/mL — AB (ref 0.30–0.70)

## 2015-08-27 LAB — CBC
HEMATOCRIT: 39.8 % (ref 39.0–52.0)
Hemoglobin: 13.3 g/dL (ref 13.0–17.0)
MCH: 27.3 pg (ref 26.0–34.0)
MCHC: 33.4 g/dL (ref 30.0–36.0)
MCV: 81.7 fL (ref 78.0–100.0)
PLATELETS: 205 10*3/uL (ref 150–400)
RBC: 4.87 MIL/uL (ref 4.22–5.81)
RDW: 16 % — AB (ref 11.5–15.5)
WBC: 5.1 10*3/uL (ref 4.0–10.5)

## 2015-08-27 LAB — ECHOCARDIOGRAM COMPLETE
Height: 73 in
WEIGHTICAEL: 3703.73 [oz_av]

## 2015-08-27 LAB — URIC ACID: URIC ACID, SERUM: 9.8 mg/dL — AB (ref 4.4–7.6)

## 2015-08-27 SURGERY — LEFT HEART CATH AND CORONARY ANGIOGRAPHY
Anesthesia: LOCAL

## 2015-08-27 MED ORDER — LIDOCAINE HCL (PF) 1 % IJ SOLN
INTRAMUSCULAR | Status: DC | PRN
  Administered 2015-08-27: 2 mL

## 2015-08-27 MED ORDER — ENOXAPARIN SODIUM 40 MG/0.4ML ~~LOC~~ SOLN
40.0000 mg | SUBCUTANEOUS | Status: DC
  Administered 2015-08-28: 40 mg via SUBCUTANEOUS
  Filled 2015-08-27: qty 0.4

## 2015-08-27 MED ORDER — HEPARIN SODIUM (PORCINE) 1000 UNIT/ML IJ SOLN
INTRAMUSCULAR | Status: AC
  Filled 2015-08-27: qty 1

## 2015-08-27 MED ORDER — HEPARIN (PORCINE) IN NACL 2-0.9 UNIT/ML-% IJ SOLN
INTRAMUSCULAR | Status: AC
  Filled 2015-08-27: qty 1500

## 2015-08-27 MED ORDER — SODIUM CHLORIDE 0.9% FLUSH: 3.0000 mL | INTRAVENOUS | Status: DC | PRN

## 2015-08-27 MED ORDER — SODIUM CHLORIDE 0.9% FLUSH
3.0000 mL | Freq: Two times a day (BID) | INTRAVENOUS | Status: DC
  Administered 2015-08-27: 3 mL via INTRAVENOUS

## 2015-08-27 MED ORDER — VERAPAMIL HCL 2.5 MG/ML IV SOLN
INTRAVENOUS | Status: DC | PRN
  Administered 2015-08-27: 10 mL via INTRA_ARTERIAL

## 2015-08-27 MED ORDER — SODIUM CHLORIDE 0.9 % WEIGHT BASED INFUSION: 3.0000 mL/kg/h | INTRAVENOUS | Status: AC

## 2015-08-27 MED ORDER — COLCHICINE 0.6 MG PO TABS
0.6000 mg | ORAL_TABLET | Freq: Every day | ORAL | Status: DC
  Administered 2015-08-28: 0.6 mg via ORAL
  Filled 2015-08-27: qty 1

## 2015-08-27 MED ORDER — VERAPAMIL HCL 2.5 MG/ML IV SOLN
INTRAVENOUS | Status: AC
  Filled 2015-08-27: qty 2

## 2015-08-27 MED ORDER — LIDOCAINE HCL (PF) 1 % IJ SOLN
INTRAMUSCULAR | Status: AC
  Filled 2015-08-27: qty 30

## 2015-08-27 MED ORDER — SODIUM CHLORIDE 0.9 % IV SOLN: 250.0000 mL | INTRAVENOUS | Status: DC | PRN

## 2015-08-27 MED ORDER — HEPARIN (PORCINE) IN NACL 2-0.9 UNIT/ML-% IJ SOLN
INTRAMUSCULAR | Status: DC | PRN
  Administered 2015-08-27: 1500 mL

## 2015-08-27 MED ORDER — HYDRALAZINE HCL 20 MG/ML IJ SOLN
10.0000 mg | Freq: Four times a day (QID) | INTRAMUSCULAR | Status: DC | PRN
  Administered 2015-08-27 – 2015-08-28 (×3): 10 mg via INTRAVENOUS
  Filled 2015-08-27 (×3): qty 1

## 2015-08-27 MED ORDER — COLCHICINE 0.6 MG PO TABS
1.2000 mg | ORAL_TABLET | Freq: Once | ORAL | Status: AC
  Administered 2015-08-27: 1.2 mg via ORAL
  Filled 2015-08-27: qty 2

## 2015-08-27 MED ORDER — COLCHICINE 0.6 MG PO TABS
0.6000 mg | ORAL_TABLET | Freq: Once | ORAL | Status: AC
  Administered 2015-08-27: 0.6 mg via ORAL
  Filled 2015-08-27: qty 1

## 2015-08-27 MED ORDER — MIDAZOLAM HCL 2 MG/2ML IJ SOLN
INTRAMUSCULAR | Status: DC | PRN
  Administered 2015-08-27: 2 mg via INTRAVENOUS

## 2015-08-27 MED ORDER — FENTANYL CITRATE (PF) 100 MCG/2ML IJ SOLN
INTRAMUSCULAR | Status: AC
  Filled 2015-08-27: qty 2

## 2015-08-27 MED ORDER — MIDAZOLAM HCL 2 MG/2ML IJ SOLN
INTRAMUSCULAR | Status: AC
  Filled 2015-08-27: qty 2

## 2015-08-27 MED ORDER — FENTANYL CITRATE (PF) 100 MCG/2ML IJ SOLN
INTRAMUSCULAR | Status: DC | PRN
  Administered 2015-08-27: 25 ug via INTRAVENOUS

## 2015-08-27 MED ORDER — HEPARIN SODIUM (PORCINE) 1000 UNIT/ML IJ SOLN
INTRAMUSCULAR | Status: DC | PRN
  Administered 2015-08-27: 5000 [IU] via INTRAVENOUS

## 2015-08-27 SURGICAL SUPPLY — 11 items
CATH INFINITI 5 FR JL3.5 (CATHETERS) ×1 IMPLANT
CATH INFINITI 5FR ANG PIGTAIL (CATHETERS) ×1 IMPLANT
CATH INFINITI JR4 5F (CATHETERS) ×1 IMPLANT
DEVICE RAD COMP TR BAND LRG (VASCULAR PRODUCTS) ×1 IMPLANT
GLIDESHEATH SLEND SS 6F .021 (SHEATH) ×1 IMPLANT
KIT HEART LEFT (KITS) ×2 IMPLANT
PACK CARDIAC CATHETERIZATION (CUSTOM PROCEDURE TRAY) ×2 IMPLANT
SYR MEDRAD MARK V 150ML (SYRINGE) ×2 IMPLANT
TRANSDUCER W/STOPCOCK (MISCELLANEOUS) ×2 IMPLANT
TUBING CIL FLEX 10 FLL-RA (TUBING) ×2 IMPLANT
WIRE SAFE-T 1.5MM-J .035X260CM (WIRE) ×1 IMPLANT

## 2015-08-27 NOTE — Progress Notes (Signed)
ANTICOAGULATION CONSULT NOTE  Pharmacy Consult for heparin Indication: chest pain/ACS  Allergies  Allergen Reactions  . Shellfish Allergy Other (See Comments)    Causes Gout to flareup    Patient Measurements: Height: 6\' 1"  (185.4 cm) Weight: 231 lb 7.7 oz (105 kg) IBW/kg (Calculated) : 79.9 Heparin Dosing Weight: 101kg  Vital Signs: Temp: 98.1 F (36.7 C) (05/14 2130) Temp Source: Oral (05/14 2130) BP: 169/105 mmHg (05/14 2130) Pulse Rate: 73 (05/14 2130)  Labs:  Recent Labs  08/25/15 2230  08/26/15 0926 08/26/15 1124 08/26/15 1744 08/26/15 2210 08/27/15 0210  HGB 12.7*  --   --   --   --   --  13.3  HCT 39.7  --   --   --   --   --  39.8  PLT 222  --   --   --   --   --  205  LABPROT  --   --   --   --   --  14.0  --   INR  --   --   --   --   --  1.06  --   HEPARINUNFRC  --   --   --   --  0.21*  --  0.78*  CREATININE 1.49*  --  1.13  --   --   --   --   TROPONINI  --   < > 0.04* 0.04* 0.04* 0.03  --   < > = values in this interval not displayed.  Estimated Creatinine Clearance: 96.1 mL/min (by C-G formula based on Cr of 1.13).  Assessment: 55 y.o. male with chest pain for heparin  Goal of Therapy:  Heparin level 0.3-0.7 units/ml Monitor platelets by anticoagulation protocol: Yes   Plan:  Decrease Heparin 1500 units/hr  Geannie RisenGreg Presli Fanguy, PharmD, BCPS   08/27/2015 2:35 AM

## 2015-08-27 NOTE — Progress Notes (Signed)
    Subjective:  No chest pain overnight  Objective:  Vital Signs in the last 24 hours: Temp:  [97.8 F (36.6 C)-98.1 F (36.7 C)] 97.8 F (36.6 C) (05/15 0615) Pulse Rate:  [68-85] 68 (05/15 0955) Resp:  [18] 18 (05/15 0615) BP: (153-183)/(105-122) 183/122 mmHg (05/15 0955) SpO2:  [97 %-99 %] 97 % (05/15 0955)  Intake/Output from previous day:  Intake/Output Summary (Last 24 hours) at 08/27/15 1033 Last data filed at 08/27/15 0700  Gross per 24 hour  Intake    970 ml  Output      0 ml  Net    970 ml    Physical Exam: General appearance: alert, cooperative and no distress Neck: no carotid bruit and no JVD Lungs: clear to auscultation bilaterally Heart: regular rate and rhythm Extremities: extremities normal, atraumatic, no cyanosis or edema Neurologic: Grossly normal   Rate: 45-68  Rhythm: normal sinus rhythm, sinus bradycardia and premature ventricular contractions (PVC)  Lab Results:  Recent Labs  08/25/15 2230 08/27/15 0210  WBC 5.8 5.1  HGB 12.7* 13.3  PLT 222 205    Recent Labs  08/26/15 0926 08/27/15 0210  NA 140 135  K 4.2 3.9  CL 108 105  CO2 21* 24  GLUCOSE 71 94  BUN 20 21*  CREATININE 1.13 1.33*    Recent Labs  08/26/15 1744 08/26/15 2210  TROPONINI 0.04* 0.03    Recent Labs  08/26/15 2210  INR 1.06    Scheduled Meds: . aspirin  81 mg Oral Daily  . atorvastatin  80 mg Oral q1800  . metoprolol succinate  25 mg Oral Daily  . sodium chloride flush  3 mL Intravenous Q12H  . sodium chloride flush  3 mL Intravenous Q12H   Continuous Infusions: . sodium chloride 1 mL/kg/hr (08/27/15 0640)  . heparin 1,500 Units/hr (08/27/15 0330)   PRN Meds:.sodium chloride, acetaminophen **OR** acetaminophen, hydrALAZINE, labetalol, nitroGLYCERIN, sodium chloride flush   Imaging: Imaging results have been reviewed  Assessment/Plan:  55 y.o.AA male with a long-standing history of hypertension, and gout, no history of CAD. He was admitted  08/26/15 with exertional SSCP and Troponin eleavtion-0/04.   Principal Problem:   Chest pain with high risk of acute coronary syndrome Active Problems:   Essential hypertension   Elevated serum creatinine   Hypertensive cardiovascular disease (LVH)   Gout   PLAN: Cath today. Echo ordered-(significant LVH on EKG). I added hydralazine prn for HTN, (home dose of ACE not ordered- hold for cath).  F/U SCr post cath- 1.3 today. He is on high dose statin, Toprol, and ASA.   Luke Kilroy PA-C 08/27/2015, 10:33 AM [phone]  I have seen and examined the patient along with Luke Kilroy PA-C.  I have reviewed the chart, notes and new data.  I agree with PA's note.   PLAN: Cath today. Restart ACEi after cath. Consider switching to carvedilol (HR relatively slow, would not increase metoprolol). Watch for possible acute gout after contrast exposure, try to avoid diuretics.  Natassia Guthridge, MD, FACC [facility] [phone] 08/27/2015, 11:20 AM  

## 2015-08-27 NOTE — Progress Notes (Signed)
Pt was not able to watch the cardiac cath video, the educational channel did not have a signal. IT was notified.

## 2015-08-27 NOTE — H&P (View-Only) (Signed)
    Subjective:  No chest pain overnight  Objective:  Vital Signs in the last 24 hours: Temp:  [97.8 F (36.6 C)-98.1 F (36.7 C)] 97.8 F (36.6 C) (05/15 0615) Pulse Rate:  [68-85] 68 (05/15 0955) Resp:  [18] 18 (05/15 0615) BP: (153-183)/(105-122) 183/122 mmHg (05/15 0955) SpO2:  [97 %-99 %] 97 % (05/15 0955)  Intake/Output from previous day:  Intake/Output Summary (Last 24 hours) at 08/27/15 1033 Last data filed at 08/27/15 0700  Gross per 24 hour  Intake    970 ml  Output      0 ml  Net    970 ml    Physical Exam: General appearance: alert, cooperative and no distress Neck: no carotid bruit and no JVD Lungs: clear to auscultation bilaterally Heart: regular rate and rhythm Extremities: extremities normal, atraumatic, no cyanosis or edema Neurologic: Grossly normal   Rate: 45-68  Rhythm: normal sinus rhythm, sinus bradycardia and premature ventricular contractions (PVC)  Lab Results:  Recent Labs  08/25/15 2230 08/27/15 0210  WBC 5.8 5.1  HGB 12.7* 13.3  PLT 222 205    Recent Labs  08/26/15 0926 08/27/15 0210  NA 140 135  K 4.2 3.9  CL 108 105  CO2 21* 24  GLUCOSE 71 94  BUN 20 21*  CREATININE 1.13 1.33*    Recent Labs  08/26/15 1744 08/26/15 2210  TROPONINI 0.04* 0.03    Recent Labs  08/26/15 2210  INR 1.06    Scheduled Meds: . aspirin  81 mg Oral Daily  . atorvastatin  80 mg Oral q1800  . metoprolol succinate  25 mg Oral Daily  . sodium chloride flush  3 mL Intravenous Q12H  . sodium chloride flush  3 mL Intravenous Q12H   Continuous Infusions: . sodium chloride 1 mL/kg/hr (08/27/15 0640)  . heparin 1,500 Units/hr (08/27/15 0330)   PRN Meds:.sodium chloride, acetaminophen **OR** acetaminophen, hydrALAZINE, labetalol, nitroGLYCERIN, sodium chloride flush   Imaging: Imaging results have been reviewed  Assessment/Plan:  55 y.o.AA male with a long-standing history of hypertension, and gout, no history of CAD. He was admitted  08/26/15 with exertional SSCP and Troponin eleavtion-0/04.   Principal Problem:   Chest pain with high risk of acute coronary syndrome Active Problems:   Essential hypertension   Elevated serum creatinine   Hypertensive cardiovascular disease (LVH)   Gout   PLAN: Cath today. Echo ordered-(significant LVH on EKG). I added hydralazine prn for HTN, (home dose of ACE not ordered- hold for cath).  F/U SCr post cath- 1.3 today. He is on high dose statin, Toprol, and ASA.   Corine ShelterLuke Kilroy PA-C 08/27/2015, 10:33 AM [phone]  I have seen and examined the patient along with Corine ShelterLuke Kilroy PA-C.  I have reviewed the chart, notes and new data.  I agree with PA's note.   PLAN: Cath today. Restart ACEi after cath. Consider switching to carvedilol (HR relatively slow, would not increase metoprolol). Watch for possible acute gout after contrast exposure, try to avoid diuretics.  Thurmon FairMihai Sharice Harriss, MD, [facility] Center[facility]ACC [facility] [phone] 08/27/2015, 11:20 AM

## 2015-08-27 NOTE — Progress Notes (Signed)
Subjective: Patient seen and examined this morning. No chest pain or SOB overnight.  Feels well overall but does report that he feels as if he is getting an acute gout attack in his left big toe.  Symptoms feel similar to previous episodes where he has pain with extension of his great toe joint.  Objective: Vital signs in last 24 hours: Filed Vitals:   08/26/15 2130 08/27/15 0615 08/27/15 0955 08/27/15 1033  BP: 169/105 153/110 183/122 189/111  Pulse: 73 70 68   Temp: 98.1 F (36.7 C) 97.8 F (36.6 C)    TempSrc: Oral Oral    Resp: 18 18    Height:      Weight:      SpO2: 98% 99% 97%    Weight change:   Intake/Output Summary (Last 24 hours) at 08/27/15 1045 Last data filed at 08/27/15 0700  Gross per 24 hour  Intake    970 ml  Output      0 ml  Net    970 ml   General: resting in bed, no distress, pleasant HEENT: EOMI, no scleral icterus Cardiac: RRR, no rubs, murmurs or gallops.  Distal pulses intact. Pulm: clear to auscultation bilaterally, moving normal volumes of air Abd: soft, nontender, nondistended, BS present Ext: warm and well perfused, no pedal edema.  Left big toe is not erythematous or tender but he does report pain with active and passive extension of great toe.  Otherwise, full ROM Neuro: alert and oriented X3, cranial nerves II-XII grossly intact  Lab Results: Basic Metabolic Panel:  Recent Labs Lab 08/26/15 0926 08/26/15 1124 08/27/15 0210  NA 140  --  135  K 4.2  --  3.9  CL 108  --  105  CO2 21*  --  24  GLUCOSE 71  --  94  BUN 20  --  21*  CREATININE 1.13  --  1.33*  CALCIUM 8.7*  --  8.4*  MG  --  2.2  --    Liver Function Tests:  Recent Labs Lab 08/26/15 0926  AST 50*  ALT 41  ALKPHOS 86  BILITOT 1.1  PROT 7.2  ALBUMIN 3.7   CBC:  Recent Labs Lab 08/25/15 2230 08/27/15 0210  WBC 5.8 5.1  NEUTROABS 2.8  --   HGB 12.7* 13.3  HCT 39.7 39.8  MCV 82.0 81.7  PLT 222 205   Cardiac Enzymes:  Recent Labs Lab  08/26/15 1124 08/26/15 1744 08/26/15 2210  TROPONINI 0.04* 0.04* 0.03   Fasting Lipid Panel:  Recent Labs Lab 08/26/15 0248  CHOL 189  HDL 53  LDLCALC 88  TRIG 242*  CHOLHDL 3.6    Micro Results: No results found for this or any previous visit (from the past 240 hour(s)). Studies/Results: Dg Chest 2 View  08/25/2015  CLINICAL DATA:  55 year old male with chest pain EXAM: CHEST  2 VIEW COMPARISON:  Radiograph dated 11/11/2010 and 10/11/2010 FINDINGS: The heart size and mediastinal contours are within normal limits. Both lungs are clear. The visualized skeletal structures are unremarkable. IMPRESSION: No active cardiopulmonary disease. Electronically Signed   By: Elgie Collard M.D.   On: 08/25/2015 23:39   Medications: I have reviewed the patient's current medications. Scheduled Meds: . aspirin  81 mg Oral Daily  . atorvastatin  80 mg Oral q1800  . metoprolol succinate  25 mg Oral Daily  . sodium chloride flush  3 mL Intravenous Q12H  . sodium chloride flush  3 mL Intravenous Q12H  Continuous Infusions: . sodium chloride 1 mL/kg/hr (08/27/15 0640)  . heparin 1,500 Units/hr (08/27/15 0330)   PRN Meds:.sodium chloride, acetaminophen **OR** acetaminophen, hydrALAZINE, labetalol, nitroGLYCERIN, sodium chloride flush Assessment/Plan: Principal Problem:   Chest pain with high risk of acute coronary syndrome Active Problems:   Essential hypertension   Gout   Elevated serum creatinine   Hypertensive cardiovascular disease (LVH)  Chest Pain: patient with both typical and atypical features of his chest pain.  It is associated with exertion and he has history of  Hypertension.  Symptoms reportedly ongoing for approximately 1 month.  Associated diaphoresis, dyspnea, and lightheadedness.  No previous cardiac history.  EKG here with evidence of LVH, prolonged QTc.  No cardiomegaly on CXR.  Suspicious for unstable angina vs NSTEMI. - cardiology assistance appreciated - Left heart  cath today - Echo pending - troponin peaked at 0.04, trended down to 0.03 - heparin gtt - continue atorvastatin  - continue metoprolol succinate  daily - continue aspirin - follow creatine after cath with his borderline AKI - UDS negative - sublingual NTG prn  HTN: on lisinopril at home but has been non-adherent.  Previously on HCTZ but this was d/c due to frequent gout flares in 2016  - chest pain work-up as above - holding lisinopril  - added labetalol and hydralazine prn - consider losartan for uricosuric benefits given his history of gout  AKI: up to 1.3 this morning from 1.1.  His creatine was slightly elevated at 1.5 at admission.  It is unclear what his baseline is, previously 1.54 in August 2016, but normal prior to that. Previous history of ureteral stone leading to hematuria was potentially responsible for previous elevated creatine.  Possible elevated creatine 2/2 frequent NSAID use vs uncontrolled HTN - holding lisinopril  - UA with no hemoglobin  History of Alcohol Abuse: reportedly a heavy beer drinker in the past but has cut back in the last few months.  Mild AST elevation on CMET - watch out for withdrawal - encourage moderation to help with frequent gout flares  Gout: reportedly uses NSAIDs at home when he has flares.  Discussion in the past about starting him on allopurinol but this has yet to be undertaken and was limited due to financial issues. He tells me he was diagnosed many years ago and has had a previous joint aspiration to confirm diagnosis - this morning he states he is having symptoms of acute attack in his left great toe with onset today  - avoiding NSAIDs given cardiac work-up and elevated creatine - will start colchicine: initial dose of 1.2 mg of oral colchicine, followed one hour later by another 0.6 mg, for a total dose on the first day of therapy of 1.8 mg.  Then 0.6 mg daily - this will hopefully be effective since oral colchicine is most  likely to be effective if treatment is started within 12 to 24 hours of symptom onset - uric acid checked yesterday elevated 9.8 - outpatient follow up will be beneficial to see if he can get started on medication to prevent these frequent attacks plus lifestyle modifications  Diet: NPO for now, heart health after procedure.  DVT PPx: Lovenox  Code: FULL  Dispo: Disposition is deferred at this time, awaiting improvement of current medical problems.  Anticipated discharge in approximately 2-3 day(s).   The patient does not have a current PCP (Pcp Not In System) and does need an [facility] hospital follow-up appointment after discharge.  The patient does not have  transportation limitations that hinder transportation to clinic appointments.  .Services Needed at time of discharge: Y = Yes, Blank = No PT:   OT:   RN:   Equipment:   Other:       Gwynn BurlyAndrew Azhar Yogi, DO 08/27/2015, 10:45 AM

## 2015-08-27 NOTE — Progress Notes (Signed)
  Echocardiogram 2D Echocardiogram has been performed.  Arvil ChacoFoster, Kyvon Hu 08/27/2015, 12:52 PM

## 2015-08-27 NOTE — Progress Notes (Signed)
ANTICOAGULATION CONSULT NOTE  Pharmacy Consult for heparin Indication: chest pain/ACS  Allergies  Allergen Reactions  . Shellfish Allergy Other (See Comments)    Causes Gout to flareup    Patient Measurements: Height: 6\' 1"  (185.4 cm) Weight: 231 lb 7.7 oz (105 kg) IBW/kg (Calculated) : 79.9 Heparin Dosing Weight: 101kg  Vital Signs: Temp: 97.8 F (36.6 C) (05/15 0615) Temp Source: Oral (05/15 0615) BP: 160/95 mmHg (05/15 1132) Pulse Rate: 68 (05/15 0955)  Labs:  Recent Labs  08/25/15 2230  08/26/15 0926 08/26/15 1124 08/26/15 1744 08/26/15 2210 08/27/15 0210 08/27/15 1155  HGB 12.7*  --   --   --   --   --  13.3  --   HCT 39.7  --   --   --   --   --  39.8  --   PLT 222  --   --   --   --   --  205  --   LABPROT  --   --   --   --   --  14.0  --   --   INR  --   --   --   --   --  1.06  --   --   HEPARINUNFRC  --   --   --   --  0.21*  --  0.78* 0.97*  CREATININE 1.49*  --  1.13  --   --   --  1.33*  --   TROPONINI  --   < > 0.04* 0.04* 0.04* 0.03  --   --   < > = values in this interval not displayed.  Estimated Creatinine Clearance: 81.7 mL/min (by C-G formula based on Cr of 1.33).  Assessment: 55 y.o. male with chest pain continues on IV heparin. Level remains elevated despite rate adjustment. Planning cardiac cath later this afternoon.   Goal of Therapy:  Heparin level 0.3-0.7 units/ml Monitor platelets by anticoagulation protocol: Yes   Plan:  - Decrease heparin gtt to 1400 units/hr - F/u post-cath  Lysle Pearlachel Mckenlee Mangham, PharmD, BCPS Pager # [phone] 08/27/2015 12:31 PM

## 2015-08-27 NOTE — Interval H&P Note (Signed)
History and Physical Interval Note:  08/27/2015 4:38 PM  Tyler Burch  has presented today for surgery, with the diagnosis of unstable angina  The various methods of treatment have been discussed with the patient and family. After consideration of risks, benefits and other options for treatment, the patient has consented to  Procedure(s): Left Heart Cath and Coronary Angiography (N/A) as a surgical intervention .  The patient's history has been reviewed, patient examined, no change in status, stable for surgery.  I have reviewed the patient's chart and labs.  Questions were answered to the patient's satisfaction.     Theron Aristaeter [facility] - NapaJordanMD,FACC 08/27/2015 4:38 PM Cath Lab Visit (complete for each Cath Lab visit)  Clinical Evaluation Leading to the Procedure:   ACS: Yes.    Non-ACS:    Anginal Classification: CCS III  Anti-ischemic medical therapy: No Therapy  Non-Invasive Test Results: No non-invasive testing performed  Prior CABG: No previous CABG

## 2015-08-28 ENCOUNTER — Encounter (HOSPITAL_COMMUNITY): Payer: Self-pay | Admitting: Cardiology

## 2015-08-28 ENCOUNTER — Telehealth: Payer: Self-pay | Admitting: Internal Medicine

## 2015-08-28 LAB — BASIC METABOLIC PANEL
ANION GAP: 10 (ref 5–15)
BUN: 11 mg/dL (ref 6–20)
CO2: 24 mmol/L (ref 22–32)
Calcium: 9.1 mg/dL (ref 8.9–10.3)
Chloride: 103 mmol/L (ref 101–111)
Creatinine, Ser: 1.16 mg/dL (ref 0.61–1.24)
GFR calc Af Amer: >60 mL/min (ref >60)
GLUCOSE: 115 mg/dL — AB (ref 65–99)
POTASSIUM: 4.1 mmol/L (ref 3.5–5.1)
SODIUM: 137 mmol/L (ref 135–145)

## 2015-08-28 LAB — CBC
HCT: 39.7 % (ref 39.0–52.0)
HEMOGLOBIN: 13.3 g/dL (ref 13.0–17.0)
MCH: 27.4 pg (ref 26.0–34.0)
MCHC: 33.5 g/dL (ref 30.0–36.0)
MCV: 81.7 fL (ref 78.0–100.0)
Platelets: 217 10*3/uL (ref 150–400)
RBC: 4.86 MIL/uL (ref 4.22–5.81)
RDW: 16.1 % — ABNORMAL HIGH (ref 11.5–15.5)
WBC: 6.1 10*3/uL (ref 4.0–10.5)

## 2015-08-28 MED ORDER — LOSARTAN POTASSIUM 50 MG PO TABS
50.0000 mg | ORAL_TABLET | Freq: Every day | ORAL | Status: DC
  Administered 2015-08-28: 50 mg via ORAL
  Filled 2015-08-28: qty 1

## 2015-08-28 MED ORDER — METOPROLOL SUCCINATE ER 50 MG PO TB24: 50.0000 mg | ORAL_TABLET | Freq: Every day | ORAL | Status: DC

## 2015-08-28 MED ORDER — ASPIRIN 81 MG PO CHEW: 81.0000 mg | CHEWABLE_TABLET | Freq: Every day | ORAL | Status: DC

## 2015-08-28 MED ORDER — LOSARTAN POTASSIUM 50 MG PO TABS
50.0000 mg | ORAL_TABLET | Freq: Every day | ORAL | Status: DC
Start: 2015-08-28 — End: 2015-10-22

## 2015-08-28 MED ORDER — AMLODIPINE BESYLATE 5 MG PO TABS: 5.0000 mg | ORAL_TABLET | Freq: Every day | ORAL | Status: DC

## 2015-08-28 MED ORDER — PREDNISONE 20 MG PO TABS: 40.0000 mg | ORAL_TABLET | Freq: Every day | ORAL | Status: DC

## 2015-08-28 MED ORDER — PRAVASTATIN SODIUM 40 MG PO TABS: 40.0000 mg | ORAL_TABLET | Freq: Every day | ORAL | Status: DC

## 2015-08-28 NOTE — Progress Notes (Signed)
Subjective: Patient seen and examined this morning. No chest pain or SOB.  He reports tolerating cardiac cath without complaints.  States that his left great toe is not painful.  Objective: Vital signs in last 24 hours: Filed Vitals:   08/27/15 2042 08/27/15 2150 08/28/15 0412 08/28/15 0932  BP: 175/95 148/83 138/88 143/100  Pulse: 79  71 96  Temp: 97.6 F (36.4 C)  97.9 F (36.6 C)   TempSrc: Oral  Oral   Resp: 18  18   Height:      Weight:      SpO2: 99%  100% 99%   Weight change:   Intake/Output Summary (Last 24 hours) at 08/28/15 1034 Last data filed at 08/27/15 1700  Gross per 24 hour  Intake      0 ml  Output      0 ml  Net      0 ml   General: resting in bed, no distress, pleasant HEENT: EOMI, no scleral icterus Pulm: normal effort Ext: warm and well perfused, no pedal edema.  Full ROM of left great toe with no discomfort Neuro: alert and oriented X3, cranial nerves II-XII grossly intact  Lab Results: Basic Metabolic Panel:  Recent Labs Lab 08/26/15 1124 08/27/15 0210 08/28/15 0924  NA  --  135 137  K  --  3.9 4.1  CL  --  105 103  CO2  --  24 24  GLUCOSE  --  94 115*  BUN  --  21* 11  CREATININE  --  1.33* 1.16  CALCIUM  --  8.4* 9.1  MG 2.2  --   --    Liver Function Tests:  Recent Labs Lab 08/26/15 0926  AST 50*  ALT 41  ALKPHOS 86  BILITOT 1.1  PROT 7.2  ALBUMIN 3.7   CBC:  Recent Labs Lab 08/25/15 2230 08/27/15 0210 08/28/15 0348  WBC 5.8 5.1 6.1  NEUTROABS 2.8  --   --   HGB 12.7* 13.3 13.3  HCT 39.7 39.8 39.7  MCV 82.0 81.7 81.7  PLT 222 205 217   Cardiac Enzymes:  Recent Labs Lab 08/26/15 1124 08/26/15 1744 08/26/15 2210  TROPONINI 0.04* 0.04* 0.03   Fasting Lipid Panel:  Recent Labs Lab 08/26/15 0248  CHOL 189  HDL 53  LDLCALC 88  TRIG 242*  CHOLHDL 3.6    Micro Results: No results found for this or any previous visit (from the past 240 hour(s)). Studies/Results: No results found. Medications: I  have reviewed the patient's current medications. Scheduled Meds: . aspirin  81 mg Oral Daily  . atorvastatin  80 mg Oral q1800  . colchicine  0.6 mg Oral Daily  . enoxaparin (LOVENOX) injection  40 mg Subcutaneous Q24H  . losartan  50 mg Oral Daily  . metoprolol succinate  25 mg Oral Daily  . sodium chloride flush  3 mL Intravenous Q12H  . sodium chloride flush  3 mL Intravenous Q12H   Continuous Infusions:   PRN Meds:.sodium chloride, acetaminophen **OR** acetaminophen, hydrALAZINE, labetalol, nitroGLYCERIN, sodium chloride flush Assessment/Plan: Principal Problem:   Chest pain with high risk of acute coronary syndrome Active Problems:   Essential hypertension   Gout   Elevated serum creatinine   Hypertensive cardiovascular disease (LVH)  Chest Pain: patient had cath 5/15 which was normal with EF >65%, normal coronaries, normal left ventricle size.  Medical management recommended.  Based on patient ASCVD 10-year risk >10%, recommended patient be on a moderate-high intensity statin from  his lipid panel obtained at admission. - cardiology assistance appreciated - Left heart cath as above - due to patient not currently having insurance, will discharge with moderate intensity statin (Pravastatin 40mg ) and will provide coupon. - with no evidence of reduced EF and normal coronaries, medical management of his HTN will be important.  Will plan for discharge on calcium channel blocker plus losartan.  As an African-American, hopefully amlodipine will provide good blood pressure control combined with losartan to help with uricosuric benefit given his history of gout.  Will give patient GoodRx coupons for both. - continue aspirin  HTN: on lisinopril at home but has been non-adherent.  Previously on HCTZ but this was d/c due to frequent gout flares in 2016  - chest pain work-up as above - blood pressure plan as above - avoiding diuretics with his gout history  AKI: Resolved.  His creatine was  slightly elevated at 1.5 at admission.  It is unclear what his baseline is, previously 1.54 in August 2016, but normal prior to that. Previous history of ureteral stone leading to hematuria was potentially responsible for previous elevated creatine.  Possible elevated creatine 2/2 frequent NSAID use vs uncontrolled HTN  History of Alcohol Abuse: reportedly a heavy beer drinker in the past but has cut back in the last few months.  Mild AST elevation on CMET - encourage moderation to help with frequent gout flares  Gout: reportedly uses NSAIDs at home when he has flares.  Discussion in the past about starting him on allopurinol but this has yet to be undertaken and was limited due to financial issues. He tells me he was diagnosed many years ago and has had a previous joint aspiration to confirm diagnosis - this morning he states his symptoms in left foot have improved on colchicine - avoiding NSAIDs given cardiac work-up and elevated creatine - started colchicine yesterday - will d/c with short course of prednisone due to financial issues [facility] - uric acid checked prior to symptoms of his flair was elevated 9.8 - outpatient follow up will be beneficial to see if he can get started on medication to prevent these frequent attacks plus lifestyle modifications  Diet: heart healthy  DVT PPx: Lovenox  Code: FULL  Dispo: Home today  The patient does not have a current PCP (Pcp Not In System) and [facility].  The patient does not have transportation limitations that hinder transportation to clinic appointments.  .Services Needed at time of discharge: Y = Yes, Blank = No PT:   OT:   RN:   Equipment:   Other:       Gwynn BurlyAndrew Dorcus Riga, DO 08/28/2015, 10:34 AM

## 2015-08-28 NOTE — Discharge Instructions (Signed)
[name],  It was a pleasure taking care of you while you were in the hospital.  We are discharging you on some new medications for your blood pressure and high cholesterol.  [facility]  1. Losartan 50mg  daily for blood pressure 2. Amlodipine 5mg  daily for blood pressure 3. Pravastatin 40mg  daily for high cholesterol 4. Aspirin 81 mg daily for heart health.  You can buy this over the counter 5. Prednisone 40mg  daily for 4 days.  This is to treat your gout attack  Please follow up in our Internal Medicine Clinic.  An appointment has been scheduled for you.

## 2015-08-28 NOTE — Care Management Note (Signed)
Case Management Note Donn PieriniKristi Ifrah Vest RN, BSN Unit 2W-Case Manager [phone]  Patient Details  Name: Tyler FernsCarl Burch MRN: [id number] Date of Birth: [birth year]  Subjective/Objective:    Pt admitted with chest pain                Action/Plan: PTA pt lived at home- plan to return home- referral for possible medication needs- d/c meds reviewed - pt discharged on generic meds- no assistance needed- [facility].   Expected Discharge Date:  08/28/15               Expected Discharge Plan:  Home/Self Care  In-House Referral:     Discharge planning Services  CM Consult, Medication Assistance  Post Acute Care Choice:    Choice offered to:     DME Arranged:    DME Agency:     HH Arranged:    HH Agency:     Status of Service:  Completed, signed off  Medicare Important Message Given:    Date Medicare IM Given:    Medicare IM give by:    Date Additional Medicare IM Given:    Additional Medicare Important Message give by:     If discussed at Long Length of Stay Meetings, dates discussed:    Additional Comments:  Darrold SpanWebster, Amour Cutrone Hall, RN 08/28/2015, 4:39 PM

## 2015-08-28 NOTE — Discharge Summary (Signed)
Name: Tyler Burch MRN: [id number] DOB: 03/12/1961 55 y.o. PCP: Pcp Not In System  Date of Admission: 08/25/2015 10:12 PM Date of Discharge: 08/28/2015 Attending Physician: Inez CatalinaEmily B Mullen, MD  Discharge Diagnosis:  Principal Problem:   Chest pain with high risk of acute coronary syndrome Active Problems:   Essential hypertension   Gout   Elevated serum creatinine   Hypertensive cardiovascular disease (LVH)  Discharge Medications:   Medication List    STOP taking these medications        lisinopril 10 MG tablet  Commonly known as:  PRINIVIL,ZESTRIL     naproxen sodium 220 MG tablet  Commonly known as:  ANAPROX      TAKE these medications        amLODipine 5 MG tablet  Commonly known as:  NORVASC  Take 1 tablet (5 mg total) by mouth daily.     aspirin 81 MG chewable tablet  Chew 1 tablet (81 mg total) by mouth daily.     losartan 50 MG tablet  Commonly known as:  COZAAR  Take 1 tablet (50 mg total) by mouth daily.     pravastatin 40 MG tablet  Commonly known as:  PRAVACHOL  Take 1 tablet (40 mg total) by mouth daily.     predniSONE 20 MG tablet  Commonly known as:  DELTASONE  Take 2 tablets (40 mg total) by mouth daily with breakfast.        Disposition and follow-up:   [name] [facility] in Stable [facility].    1.  At the hospital follow up visit please address:  - any more chest pain? - blood pressure medication adherence.  How is his BP doing? Consider increasing doses - remind him to follow up with cardiology - did he take prednisone for his gout? Has it resolved? - discuss treatment for gout prevention  2.  Labs / imaging needed at time of follow-up: CMET  3.  Pending labs/ test needing follow-up: none  Follow-up Appointments: Follow-up Information    Follow up with Heywood IlesPatel, Rushil, MD On 09/11/2015.   Specialty:  Internal Medicine   Why:  appointment time 10:45am   Contact information:   [address] [place] [address] [phone]       Schedule an appointment as soon as possible for a visit with Thurmon FairMihai Croitoru, MD.   Specialty:  Cardiology   Contact information:   [address] [place] [address] [phone]       Discharge Instructions:   Consultations: Treatment Team:  Laurey Moralealton S McLean, MD  Procedures Performed:  Dg Chest 2 View  08/25/2015  CLINICAL DATA:  55 year old male with chest pain EXAM: CHEST  2 VIEW COMPARISON:  Radiograph dated 11/11/2010 and 10/11/2010 FINDINGS: The heart size and mediastinal contours are within normal limits. Both lungs are clear. The visualized skeletal structures are unremarkable. IMPRESSION: No active cardiopulmonary disease. Electronically Signed   By: Elgie CollardArash  Radparvar M.D.   On: 08/25/2015 23:39    2D Echo:  - Left ventricle: The cavity size was normal. There was mild  concentric hypertrophy. Systolic function was normal. The  estimated ejection fraction was in the range of 60% to 65%. Wall  motion was normal; there were no regional wall motion  abnormalities. Doppler parameters are consistent with abnormal  left ventricular relaxation (grade 1 diastolic dysfunction).  There are intermediate filling pressure by Doppler parameters. - Aortic valve: Trileaflet; normal thickness leaflets. There  was no  regurgitation. - Aortic root: The aortic root was normal in size. - Mitral valve: There was trivial regurgitation. - Left atrium: The atrium was normal in size. - Right ventricle: Systolic function was normal. - Right atrium: The atrium was normal in size. - Tricuspid valve: There was mild regurgitation. - Pulmonic valve: There was no regurgitation. - Pulmonary arteries: Systolic pressure was within the normal  range. - Inferior vena cava: The vessel was normal in size. - Pericardium, extracardiac: There was no pericardial effusion.  Cardiac Cath:   The left ventricular systolic function is  normal.  1. Normal coronary anatomy 2. Vigorous LV function  Plan: medical therapy.  Admission HPI:  [name] is a 55 year old male with PMH of HTN and Gout who presents with new-onset chest pain. He was at a family member's outdoor barbecue gathering when he began to feel a sharp, pressure-like chest pain that felt deep to his right chest wall and moved across to his left chest. This occurred around 6 pm while he was walking. He states the pain was 5-6/10 in intensity. The pain did not radiate to his arms, neck, jaw, or back. He felt diaphoretic, hot, and short of breath during this time. He reports associated intermittent mild headaches, but no change in vision, slurred speech, facial droop, focal weakness, loss of consciousness, N/V/D/C, or myalgias. He says he found some relief of his symptoms with rest. EMS were called and he was given a full dose Aspirin and nitrogylcerin with further improvement of his pain to a 2/10. 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  Hospital Course by problem list: Principal Problem:   Chest pain with high risk of acute coronary syndrome Active Problems:   Essential hypertension   Gout   Elevated serum creatinine   Hypertensive cardiovascular disease (LVH)   Chest Pain: patient had cath 5/15 which was normal with EF >65%, normal coronaries, normal left ventricle size. Medical management recommended. Based on patient ASCVD 10-year risk >10%, recommended patient be on a moderate-high intensity statin from his lipid panel obtained at admission.  Due to patient not currently having insurance, will discharge with moderate intensity statin (Pravastatin 40mg ).  With no evidence of reduced EF and normal coronaries, medical management of his HTN will be important. He was on beta blocker in the hospital but due to the above, we opted for management with CCB and ARB.  Will plan for discharge on calcium  channel blocker plus losartan. As an African-American, hopefully amlodipine will provide good blood pressure control combined with losartan to help with uricosuric benefit given his history of gout. Will give patient GoodRx coupons for both.  We also continued his aspirin.  HTN: He is supposed to be on lisinopril at home but has been non-adherent. Previously on HCTZ but this was d/c due to frequent gout flares in 2016.  Chest pain and HTN work up as described above.   AKI: Resolved. His creatine was slightly elevated at 1.5 at admission. It is unclear what his baseline is, previously 1.54 in August 2016, but normal prior to that. Previous history of ureteral stone leading to hematuria was potentially responsible for previous elevated creatine. Possible elevated creatine 2/2 frequent NSAID use vs uncontrolled HTN  History of Alcohol Abuse: reportedly a heavy beer drinker in the past but has cut back in the last few months. Mild AST elevation on CMET.  We encouraged moderation to help with frequent gout flares as well as for overall health.  Gout: reportedly uses NSAIDs at home when he has flares. Discussion in the past about starting him on allopurinol but this has yet to be undertaken and was limited due to financial issues. He tells me he was diagnosed many years ago and has had a previous joint aspiration to confirm diagnosis.  He reported symptoms of gout flare in left great toe during hospitalization and was started on colchicine.  We were avoiding NSAIDs given cardiac work-up and elevated creatine from admission. We discharged on short course of prednisone due to financial issues (on $4 list at [facility]).  His uric acid checked prior to symptoms of his flair and was elevated 9.8 to give [place] a baseline.  Recommneding outpatient follow up to see if he can get started on medication to prevent these frequent attacks plus lifestyle modifications.  Discharge Vitals:   BP 172/111 mmHg  Pulse 57   Temp(Src) 97.9 F (36.6 C) (Oral)  Resp 18  Ht  (1.854 m)  Wt 231 lb 7.7 oz (105 kg)  BMI 30.55 kg/m2  SpO2 99%  Discharge Labs:  Results for orders placed or performed during the hospital encounter of 08/25/15 (from the past 24 hour(s))  CBC     Status: Abnormal   Collection Time: 08/28/15  3:48 AM  Result Value Ref Range   WBC 6.1 4.0 - 10.5 K/uL   RBC 4.86 4.22 - 5.81 MIL/uL   Hemoglobin 13.3 13.0 - 17.0 g/dL   HCT 16.1 09.6 - 04.5 %   MCV 81.7 78.0 - 100.0 fL   MCH 27.4 26.0 - 34.0 pg   MCHC 33.5 30.0 - 36.0 g/dL   RDW 40.9 (H) 81.1 - 91.4 %   Platelets 217 150 - 400 K/uL  Basic metabolic panel     Status: Abnormal   Collection Time: 08/28/15  9:24 AM  Result Value Ref Range   Sodium 137 135 - 145 mmol/L   Potassium 4.1 3.5 - 5.1 mmol/L   Chloride 103 101 - 111 mmol/L   CO2 24 22 - 32 mmol/L   Glucose, Bld 115 (H) 65 - 99 mg/dL   BUN 11 6 - 20 mg/dL   Creatinine, Ser 7.82 0.61 - 1.24 mg/dL   Calcium 9.1 8.9 - 95.6 mg/dL   GFR calc non Af Amer >60 >60 mL/min   GFR calc Af Amer >60 >60 mL/min   Anion gap 10 5 - 15    Signed: Gwynn Burly, DO 08/28/2015, 1:44 PM

## 2015-08-28 NOTE — Telephone Encounter (Signed)
Pt need TOC scheduled for 09/11/15 at 10:45 with dr Delia Chimes Patel.

## 2015-08-28 NOTE — Progress Notes (Signed)
    Subjective:  No complaints. Denies CP and SOB.   Objective:  Vital Signs in the last 24 hours: Temp:  [97.6 F (36.4 C)-98.2 F (36.8 C)] 97.9 F (36.6 C) (05/16 0412) Pulse Rate:  [0-126] 71 (05/16 0412) Resp:  [0-42] 18 (05/16 0412) BP: (138-197)/(83-122) 138/88 mmHg (05/16 0412) SpO2:  [0 %-100 %] 100 % (05/16 0412)  Intake/Output from previous day:  Intake/Output Summary (Last 24 hours) at 08/28/15 0906 Last data filed at 08/27/15 1700  Gross per 24 hour  Intake      0 ml  Output      0 ml  Net      0 ml    Physical Exam: General appearance: alert, cooperative and no distress Neck: no carotid bruit and no JVD Lungs: clear to auscultation bilaterally Heart: regular rate and rhythm Extremities: extremities normal, atraumatic, no cyanosis or edema Neurologic: Grossly normal  Skin: warm and dry, rt radial cath site dressing dry.    Rate: 59  Rhythm: sinus brady  Lab Results:  Recent Labs  08/27/15 0210 08/28/15 0348  WBC 5.1 6.1  HGB 13.3 13.3  PLT 205 217    Recent Labs  08/26/15 0926 08/27/15 0210  NA 140 135  K 4.2 3.9  CL 108 105  CO2 21* 24  GLUCOSE 71 94  BUN 20 21*  CREATININE 1.13 1.33*    Recent Labs  08/26/15 1744 08/26/15 2210  TROPONINI 0.04* 0.03    Recent Labs  08/26/15 2210  INR 1.06    Scheduled Meds: . aspirin  81 mg Oral Daily  . atorvastatin  80 mg Oral q1800  . colchicine  0.6 mg Oral Daily  . enoxaparin (LOVENOX) injection  40 mg Subcutaneous Q24H  . losartan  50 mg Oral Daily  . metoprolol succinate  25 mg Oral Daily  . sodium chloride flush  3 mL Intravenous Q12H  . sodium chloride flush  3 mL Intravenous Q12H   Continuous Infusions:   PRN Meds:.sodium chloride, acetaminophen **OR** acetaminophen, hydrALAZINE, labetalol, nitroGLYCERIN, sodium chloride flush   Imaging: Imaging results have been reviewed  Assessment/Plan:  55 y.o.AA male with a long-standing history of hypertension, and gout, no  history of CAD. He was admitted 08/26/15 with exertional SSCP and Troponin eleavtion.  Principal Problem:   Chest pain with high risk of acute coronary syndrome Active Problems:   Essential hypertension   Gout   Elevated serum creatinine   Hypertensive cardiovascular disease (LVH)   PLAN:  1. Chest pain s/p cardiac cath 5/15-- cardiac cath normal, EF >65%, left ventricular size normal. BMET pending. Continue statin, BB, and asa.  2. HTN-- on losartan and metoprolol.  3. HLD-- LDL 88, on high intensity statin    Gara Kroneriana Truong, MD 08/28/2015, 9:06 AM   I have seen and examined the patient along with Gara Kroneriana Truong, MD.  I have reviewed the chart, notes and new data.  I agree with her note.  Key new complaints: feels back to normal Key examination changes: BP remains elevated Key new findings / data: cath findings reviewed.  PLAN: Cardiac problems appear to be related primarily to uncontrolled BP. Increased beta blocker dose. Will need additional titration of BP meds as an outpatient. Will arrange follow up.  Thurmon FairMihai Derward Marple, MD, [facility] [phone] 08/28/2015, 12:46 PM

## 2015-08-29 NOTE — Telephone Encounter (Signed)
No answer, no vmail 

## 2015-09-03 NOTE — Telephone Encounter (Signed)
Unsuccessful #2

## 2015-09-05 NOTE — Telephone Encounter (Signed)
#  3 TOC, closed

## 2015-09-07 ENCOUNTER — Telehealth: Payer: Self-pay

## 2015-09-07 NOTE — Telephone Encounter (Signed)
APT. REMINDER CALL, NO ANSWER °

## 2015-09-11 ENCOUNTER — Ambulatory Visit: Payer: Self-pay | Admitting: Internal Medicine

## 2015-10-22 ENCOUNTER — Other Ambulatory Visit: Payer: Self-pay | Admitting: Internal Medicine

## 2015-10-22 ENCOUNTER — Ambulatory Visit (INDEPENDENT_AMBULATORY_CARE_PROVIDER_SITE_OTHER): Payer: Self-pay | Admitting: Internal Medicine

## 2015-10-22 ENCOUNTER — Ambulatory Visit: Payer: Self-pay | Admitting: Pharmacist

## 2015-10-22 VITALS — BP 176/102 | HR 71 | Temp 97.7°F | Ht 73.0 in | Wt 229.1 lb

## 2015-10-22 DIAGNOSIS — I10 Essential (primary) hypertension: Secondary | ICD-10-CM

## 2015-10-22 DIAGNOSIS — R0781 Pleurodynia: Secondary | ICD-10-CM

## 2015-10-22 DIAGNOSIS — Z79899 Other long term (current) drug therapy: Secondary | ICD-10-CM

## 2015-10-22 DIAGNOSIS — E785 Hyperlipidemia, unspecified: Secondary | ICD-10-CM

## 2015-10-22 MED ORDER — AMLODIPINE BESYLATE 5 MG PO TABS: 5.0000 mg | ORAL_TABLET | Freq: Every day | ORAL | Status: DC

## 2015-10-22 MED ORDER — PRAVASTATIN SODIUM 40 MG PO TABS: 40.0000 mg | ORAL_TABLET | Freq: Every day | ORAL | Status: DC

## 2015-10-22 MED ORDER — ASPIRIN 81 MG PO CHEW: 81.0000 mg | CHEWABLE_TABLET | Freq: Every day | ORAL | Status: DC

## 2015-10-22 MED ORDER — LOSARTAN POTASSIUM 50 MG PO TABS: 50.0000 mg | ORAL_TABLET | Freq: Every day | ORAL | Status: DC

## 2015-10-22 MED FILL — ATORVASTATIN 20 MG TABLET: 20 | 30 days supply | Qty: 30 | Fill #0

## 2015-10-22 MED FILL — AMLODIPINE BESYLATE 5 MG TA: 5 | 30 days supply | Qty: 30 | Fill #0

## 2015-10-22 MED FILL — LOSARTAN POTASSIUM 50 MG TA: 50 | 30 days supply | Qty: 30 | Fill #0

## 2015-10-22 NOTE — Assessment & Plan Note (Signed)
A: Based on patient ASCVD 10-year risk >10%, recommended patient be on a moderate-high intensity statin from his lipid panel obtained at admission. Due to patient not currently having insurance, will discharge with moderate intensity statin (Pravastatin 40mg ).   P: - refilled statin.

## 2015-10-22 NOTE — Assessment & Plan Note (Addendum)
BP Readings from Last 3 Encounters:  10/22/15 176/102  08/28/15 172/111  12/04/14 153/83   Overview: Patient was hospitalized in May 2017 for chest pain.  He had cath 5/15 which was normal with EF >65%, normal coronaries, normal left ventricle size. Medical management recommended. Based on patient ASCVD 10-year risk >10%, recommended patient be on a moderate-high intensity statin from his lipid panel obtained at admission. Due to patient not having insurance, he was discharged with moderate intensity statin (Pravastatin 40mg ). With no evidence of reduced EF and normal coronaries, medical management of his HTN is important. He was on beta blocker in the hospital, but due to the above, we opted for management with CCB and ARB.  A:  He is hypertensive her today but has been without his medications for a few days.  Prior to running out, he states he had been taking consistently but I am unsure of this because at hospital discharge, he was only given 30 day supply.  Recalls BP at home reading of 128/86.  P: - had our clinic pharmacist meet with patient and assist with acquiring meds - continue amlodipine 5mg  daily and losartan 50mg  daily for now.  Refills sent. - encouraged patient to keep BP log at home 3-4 days per week and bring to future appointments - encouraged low salt diet and adequate exercise - RTC 1 month

## 2015-10-22 NOTE — Progress Notes (Signed)
Physician pharmacist co-visit  Referred [facility] to [facility] and provided information on low-cost medication options.

## 2015-10-22 NOTE — Patient Instructions (Signed)
Thank you for coming to see me today. It was a pleasure. Today we talked about:   Blood Pressure: it is high today probably because you did not take your medicine.  Our pharmacy will work with you to get access to meds.  Rib Pain: use ice and heating pads as needed along with Tylenol as needed.  Please come back if not getting better.  Please follow-up with us in 1 month for BP check.  If you have any questions or concerns, please do not hesitate to call the office at [phone].  Take Care,   Gwynn BurlyAndrew Karma Hiney, DO

## 2015-10-22 NOTE — Assessment & Plan Note (Signed)
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  P: - recommend tylenol, heat, ice as needed - avoid NSAIDs with his HTN - RTC if not improving in 1-2 weeks to consider imaging

## 2015-10-22 NOTE — Progress Notes (Signed)
Patient ID: Vivi FernsCarl Rikard, male   DOB: 1960-12-16, 55 y.o.   MRN: [id number]   CC: here for medication refills and rib pain  HPI:  Mr.Brooke Edwyna ShellHart is a 55 y.o. man with PMHx as detailed below presenting for refills of his medication and for rib pain x 3 days.  Please see A&P for status of medical conditions addressed today.  Past Medical History  Diagnosis Date  . Hypertension   . Gout     Review of Systems:   Review of Systems  Constitutional: Negative for fever and chills.  Eyes: Negative for blurred vision.  Cardiovascular: Negative for chest pain.  Musculoskeletal:       + Rib pain  Neurological: Negative for headaches.     Physical Exam:  Filed Vitals:   10/22/15 0919  BP: 176/102  Pulse: 71  Temp: 97.7 F (36.5 C)  TempSrc: Oral  Height: 6\' 1"  (1.854 m)  Weight: 229 lb 1.6 oz (103.919 kg)  SpO2: 100%   Physical Exam  Constitutional: He is oriented to person, place, and time. He appears well-developed and well-nourished. No distress.  Eyes: EOM are normal.  Cardiovascular: Normal rate and regular rhythm.   Pulmonary/Chest: Effort normal.  Musculoskeletal:  Tender to palpation over ribs on right side.  Neurological: He is alert and oriented to person, place, and time.     Assessment & Plan:   See encounters tab for problem based medical decision making.   Patient discussed with Dr. Criselda PeachesMullen   No problem-specific assessment & plan notes found for this encounter.

## 2015-10-23 NOTE — Progress Notes (Signed)
Internal Medicine Clinic Attending  Case discussed with Dr. Wallace soon after the resident saw the patient.  We reviewed the resident's history and exam and pertinent patient test results.  I agree with the assessment, diagnosis, and plan of care documented in the resident's note. 

## 2015-10-23 NOTE — Addendum Note (Signed)
Addended by: Debe CoderMULLEN, Irene Mitcham B on: 10/23/2015 09:09 AM   Modules accepted: Level of Service

## 2015-11-02 ENCOUNTER — Ambulatory Visit: Payer: Self-pay

## 2015-11-22 ENCOUNTER — Ambulatory Visit (INDEPENDENT_AMBULATORY_CARE_PROVIDER_SITE_OTHER): Payer: Self-pay | Admitting: Internal Medicine

## 2015-11-22 ENCOUNTER — Encounter (INDEPENDENT_AMBULATORY_CARE_PROVIDER_SITE_OTHER): Payer: Self-pay

## 2015-11-22 ENCOUNTER — Encounter: Payer: Self-pay | Admitting: Internal Medicine

## 2015-11-22 ENCOUNTER — Other Ambulatory Visit: Payer: Self-pay | Admitting: Pharmacist

## 2015-11-22 VITALS — BP 155/116 | HR 81 | Temp 97.8°F | Ht 73.0 in | Wt 219.1 lb

## 2015-11-22 DIAGNOSIS — Z0000 Encounter for general adult medical examination without abnormal findings: Secondary | ICD-10-CM

## 2015-11-22 DIAGNOSIS — I10 Essential (primary) hypertension: Secondary | ICD-10-CM

## 2015-11-22 DIAGNOSIS — E785 Hyperlipidemia, unspecified: Secondary | ICD-10-CM

## 2015-11-22 DIAGNOSIS — I119 Hypertensive heart disease without heart failure: Secondary | ICD-10-CM

## 2015-11-22 DIAGNOSIS — M1000 Idiopathic gout, unspecified site: Secondary | ICD-10-CM

## 2015-11-22 DIAGNOSIS — M109 Gout, unspecified: Secondary | ICD-10-CM

## 2015-11-22 DIAGNOSIS — Z85038 Personal history of other malignant neoplasm of large intestine: Secondary | ICD-10-CM

## 2015-11-22 MED ORDER — ATORVASTATIN CALCIUM 20 MG PO TABS: 20.0000 mg | ORAL_TABLET | Freq: Every day | ORAL | 2 refills | Status: DC

## 2015-11-22 MED ORDER — ALLOPURINOL 100 MG PO TABS: 100.0000 mg | ORAL_TABLET | Freq: Every day | ORAL | 2 refills | Status: DC

## 2015-11-22 MED ORDER — ASPIRIN 81 MG PO CHEW: 81.0000 mg | CHEWABLE_TABLET | Freq: Every day | ORAL | 2 refills | Status: DC

## 2015-11-22 MED ORDER — AMLODIPINE BESYLATE 5 MG PO TABS: 5.0000 mg | ORAL_TABLET | Freq: Every day | ORAL | 2 refills | Status: DC

## 2015-11-22 MED ORDER — LOSARTAN POTASSIUM 50 MG PO TABS: 50.0000 mg | ORAL_TABLET | Freq: Every day | ORAL | 2 refills | Status: DC

## 2015-11-22 MED FILL — AMLODIPINE BESYLATE 5 MG TA: 5 | 30 days supply | Qty: 30 | Fill #0

## 2015-11-22 MED FILL — LOSARTAN POTASSIUM 50 MG TA: 50 | 30 days supply | Qty: 30 | Fill #0

## 2015-11-22 MED FILL — ATORVASTATIN 20 MG TABLET: 20 | 30 days supply | Qty: 30 | Fill #0

## 2015-11-22 NOTE — Patient Instructions (Signed)
Thank you for coming to see me today. It was a pleasure. Today we talked about:   Blood Pressure: - take your medicines everyday - keep a blood pressure log - try to avoid too much salt  Gout: - I have started a medication called allopurinol 100mg  daily.  Take this to prevent gout flares.  Please follow-up with us in 2-4 weeks for BP check and to check your uric acid levels.  I will call you if your blood work is abnormal.  If you have any questions or concerns, please do not hesitate to call the office at [phone].  Take Care,   Gwynn BurlyAndrew Sheneika Walstad, DO

## 2015-11-22 NOTE — Progress Notes (Signed)
CC: here for f/u HTN and HLD  HPI:  Mr.Finnean Edwyna ShellHart is a 55 y.o. man with a past medical history listed below here today for follow up of his HTN and HLD.  He also complains of some pain on the bridge of his nose that started yesterday.  No preceding trauma but he wears protective goggles at work that rest on his nose.  For details of today's visit and the status of his chronic medical issues please refer to the assessment and plan.   Past Medical History:  Diagnosis Date  . Gout   . Hyperlipidemia   . Hypertension     Review of Systems:   Review of Systems  Constitutional: Negative for chills and fever.  HENT:       Pain on bridge of nose x 1 day.  Respiratory: Negative for shortness of breath.   Cardiovascular: Negative for chest pain.     Physical Exam:  Vitals:   11/22/15 1334  BP: (!) 155/116  Pulse: 81  Temp: 97.8 F (36.6 C)  TempSrc: Oral  SpO2: 100%  Weight: 219 lb 1.6 oz (99.4 kg)  Height: 6\' 1"  (1.854 m)   Physical Exam  Constitutional: He is oriented to person, place, and time and well-developed, well-nourished, and in no distress.  HENT:  Head: Normocephalic and atraumatic.  Eyes: EOM are normal.  Cardiovascular: Normal rate and regular rhythm.   Pulmonary/Chest: Effort normal and breath sounds normal.  Neurological: He is alert and oriented to person, place, and time.  Skin: Skin is warm and dry.  Psychiatric: Mood and affect normal.    Assessment & Plan:   See Encounters Tab for problem based charting.  Patient discussed with Dr. Criselda PeachesMullen   Hyperlipidemia A: On atorvastatin 20mg  daily.  P: - continue current meds.  Health care maintenance A/P: Due for HIV, HCV, and colon cancer screening.  Positive family history of colon cancer.  Given stool cards in the past but does not appear that they were returned.  States he is working on his insurance through work which may enable him to get in with GI.  Otherwise, will attempt stool cards at  follow up.  Gout A: History of frequent gout flares that he has used NSAIDs or prednisone to treat.  Has not been on prophlaxis due to financial issues.  Uric acid checked while not having a flare in the hospital and found to be 9.8.  P: - given prescription for allopurinol 100mg  daily [facility] to begin lowering his serum uric acid to < 6 - RTC 3 weeks and will recheck uric acid level - hopefully, will not precipitate a flare while beginning treatment with allopurinol but at low dose.  Essential hypertension BP Readings from Last 3 Encounters:  11/22/15 (!) 155/116  10/22/15 (!) 176/102  08/28/15 (!) 172/111   A:  BP elevated today but patient did not take his medication today since he ran out yesterday.  Otherwise has taken consistently.  States in the past month he checked his BP 2 times and recalls being in the 120s/80s.  P: - given medications today in clinic with 2 refills authorized.  No medication changes as he did not take his meds - continue amlodipine 5mg  daily and losartan 50mg  daily for now. - encouraged patient to keep BP log at home 3-4 days per week and bring to future appointments - encouraged low salt diet and adequate exercise - RTC 3 weeks for BP check and  uric acid level - BMET done today was normal.

## 2015-11-22 NOTE — Progress Notes (Signed)
Patient referred to [facility].

## 2015-11-23 LAB — BMP8+ANION GAP
ANION GAP: 18 mmol/L (ref 10.0–18.0)
BUN/Creatinine Ratio: 13 (ref 9–20)
BUN: 16 mg/dL (ref 6–24)
CALCIUM: 9.1 mg/dL (ref 8.7–10.2)
CO2: 21 mmol/L (ref 18–29)
CREATININE: 1.2 mg/dL (ref 0.76–1.27)
Chloride: 100 mmol/L (ref 96–106)
GFR calc Af Amer: 79 mL/min/{1.73_m2} (ref >59)
GFR, EST NON AFRICAN AMERICAN: 69 mL/min/{1.73_m2} (ref >59)
Glucose: 80 mg/dL (ref 65–99)
Potassium: 4.6 mmol/L (ref 3.5–5.2)
Sodium: 139 mmol/L (ref 134–144)

## 2015-11-23 NOTE — Assessment & Plan Note (Signed)
A/P: Due for HIV, HCV, and colon cancer screening.  Positive family history of colon cancer.  Given stool cards in the past but does not appear that they were returned.  States he is working on his insurance through work which may enable him to get in with GI.  Otherwise, will attempt stool cards at follow up.

## 2015-11-23 NOTE — Assessment & Plan Note (Signed)
BP Readings from Last 3 Encounters:  11/22/15 (!) 155/116  10/22/15 (!) 176/102  08/28/15 (!) 172/111   A:  BP elevated today but patient did not take his medication today since he ran out yesterday.  Otherwise has taken consistently.  States in the past month he checked his BP 2 times and recalls being in the 120s/80s.  P: - given medications today in clinic with 2 refills authorized.  No medication changes as he did not take his meds - continue amlodipine 5mg  daily and losartan 50mg  daily for now. - encouraged patient to keep BP log at home 3-4 days per week and bring to future appointments - encouraged low salt diet and adequate exercise - RTC 3 weeks for BP check and uric acid level - BMET done today was normal.

## 2015-11-23 NOTE — Assessment & Plan Note (Signed)
A: History of frequent gout flares that he has used NSAIDs or prednisone to treat.  Has not been on prophlaxis due to financial issues.  Uric acid checked while not having a flare in the hospital and found to be 9.8.  P: - given prescription for [facility]opurinol 100mg  [facility] to begin lowering his serum uric acid to < 6 - RTC 3 weeks and will recheck uric acid level - hopefully, will not precipitate a flare while beginning treatment with allopurinol but at low dose.

## 2015-11-23 NOTE — Assessment & Plan Note (Signed)
A: On atorvastatin  daily.  P: - continue current meds.

## 2015-11-28 NOTE — Progress Notes (Signed)
Internal Medicine Clinic Attending  Case discussed with Dr. Wallace at the time of the visit.  We reviewed the resident's history and exam and pertinent patient test results.  I agree with the assessment, diagnosis, and plan of care documented in the resident's note.  

## 2015-12-05 MED ORDER — AMLODIPINE-OLMESARTAN 5-20 MG PO TABS: 1.0000 | ORAL_TABLET | Freq: Every day | ORAL | 3 refills | Status: DC

## 2015-12-05 NOTE — Addendum Note (Signed)
Addended by: Mliss FritzKIM, JENNIFER J on: 12/05/2015 11:00 AM   Modules accepted: Orders

## 2015-12-11 ENCOUNTER — Encounter: Payer: Self-pay | Admitting: *Deleted

## 2015-12-20 ENCOUNTER — Encounter: Payer: Self-pay | Admitting: Internal Medicine

## 2016-01-03 MED FILL — AMLODIPINE BESYLATE 5 MG TA: 5 | 30 days supply | Qty: 30 | Fill #0

## 2016-01-03 MED FILL — ATORVASTATIN 20 MG TABLET: 20 | 30 days supply | Qty: 30 | Fill #1

## 2016-01-03 MED FILL — LOSARTAN POTASSIUM 50 MG TA: 50 | 30 days supply | Qty: 30 | Fill #1

## 2016-01-09 ENCOUNTER — Telehealth: Payer: Self-pay | Admitting: Internal Medicine

## 2016-01-09 NOTE — Telephone Encounter (Signed)
Needs to talk to nurse about medications

## 2016-01-09 NOTE — Telephone Encounter (Signed)
APT. REMINDER CALL, NO ANSWER, NO VOICEMAIL °

## 2016-01-10 ENCOUNTER — Encounter: Payer: Self-pay | Admitting: Internal Medicine

## 2016-01-10 NOTE — Telephone Encounter (Signed)
appt made for f/u for Monday 10/3- missed last f/u appt

## 2016-01-11 ENCOUNTER — Telehealth: Payer: Self-pay | Admitting: Internal Medicine

## 2016-01-11 NOTE — Telephone Encounter (Signed)
APT. REMINDER CALL, LMTCB °

## 2016-01-14 ENCOUNTER — Ambulatory Visit: Payer: Self-pay

## 2016-01-21 ENCOUNTER — Encounter: Payer: Self-pay | Admitting: Internal Medicine

## 2016-01-21 ENCOUNTER — Ambulatory Visit: Payer: Self-pay

## 2016-03-14 ENCOUNTER — Telehealth: Payer: Self-pay | Admitting: Pharmacist

## 2016-03-14 MED FILL — ATORVASTATIN 20 MG TABLET: 20 | 30 days supply | Qty: 30 | Fill #2

## 2016-03-14 MED FILL — AMLODIPINE BESYLATE 5 MG TA: 5 | 30 days supply | Qty: 30 | Fill #1

## 2016-03-14 MED FILL — LOSARTAN POTASSIUM 50 MG TA: 50 | 30 days supply | Qty: 30 | Fill #2

## 2016-03-14 NOTE — Progress Notes (Addendum)
Contacted patient for help with medications. [facility] stated they had medications ready for pick up but patient did not show. Advised patient to schedule follow up visit and try to pick up medications/ maintain adherence, contact clinic if any concerns arise. Patient verbalized understanding.

## 2016-04-21 ENCOUNTER — Other Ambulatory Visit: Payer: Self-pay | Admitting: Internal Medicine

## 2016-04-21 DIAGNOSIS — I119 Hypertensive heart disease without heart failure: Secondary | ICD-10-CM

## 2016-04-21 DIAGNOSIS — I10 Essential (primary) hypertension: Secondary | ICD-10-CM

## 2016-04-23 ENCOUNTER — Other Ambulatory Visit: Payer: Self-pay | Admitting: Pharmacist

## 2016-04-23 DIAGNOSIS — I119 Hypertensive heart disease without heart failure: Secondary | ICD-10-CM

## 2016-04-25 ENCOUNTER — Encounter: Payer: Self-pay | Admitting: Internal Medicine

## 2016-05-05 NOTE — Progress Notes (Signed)
Patient scheduled for a follow up appointment in February in order to obtain medication refills.

## 2016-05-09 ENCOUNTER — Other Ambulatory Visit: Payer: Self-pay | Admitting: Internal Medicine

## 2016-05-09 DIAGNOSIS — I119 Hypertensive heart disease without heart failure: Secondary | ICD-10-CM

## 2016-05-09 DIAGNOSIS — I10 Essential (primary) hypertension: Secondary | ICD-10-CM

## 2016-05-09 MED ORDER — AMLODIPINE-OLMESARTAN 5-20 MG PO TABS: 1.0000 | ORAL_TABLET | Freq: Every day | ORAL | 0 refills | Status: DC

## 2016-05-09 MED ORDER — ATORVASTATIN CALCIUM 20 MG PO TABS: ORAL_TABLET | ORAL | 0 refills | Status: DC

## 2016-05-14 ENCOUNTER — Telehealth: Payer: Self-pay | Admitting: Internal Medicine

## 2016-05-14 NOTE — Telephone Encounter (Signed)
APT. REMINDER CALL, LMTCB °

## 2016-05-15 ENCOUNTER — Encounter: Payer: Self-pay | Admitting: Internal Medicine

## 2016-06-04 ENCOUNTER — Telehealth: Payer: Self-pay | Admitting: Internal Medicine

## 2016-06-04 NOTE — Telephone Encounter (Signed)
APT. REMINDER CALL, NO ANSWER, NO VOICEMAIL °

## 2016-06-05 ENCOUNTER — Encounter: Payer: Self-pay | Admitting: Internal Medicine

## 2016-06-09 IMAGING — CT CT ABD-PELV W/O CM
2 of 4 series · 8 of 46 positions shown, 9 images · non-contrast
Comparison: 10/02/2010.

CLINICAL DATA: Left flank pain since yesterday.  Hematuria.

EXAM:
CT ABDOMEN AND PELVIS WITHOUT CONTRAST
TECHNIQUE: Multidetector CT imaging of the abdomen and pelvis was performed
following the standard protocol without IV contrast.

[Series 201: stone study, idose (2) · axial · 0.86mm/px · z∈[+75,+495]mm · 5 of 112 slices shown, 6 images]
[im 14/112  soft-tissue]
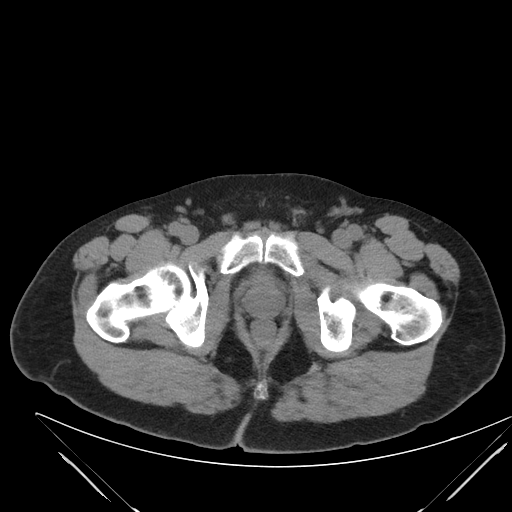
[im 14/112  bone]
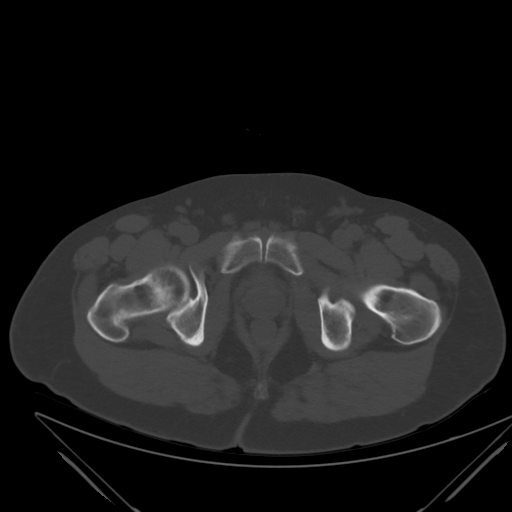
[im 33/112  soft-tissue]
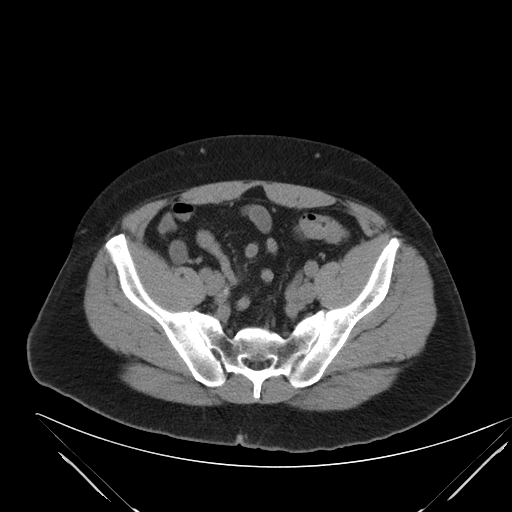
[im 56/112  soft-tissue]
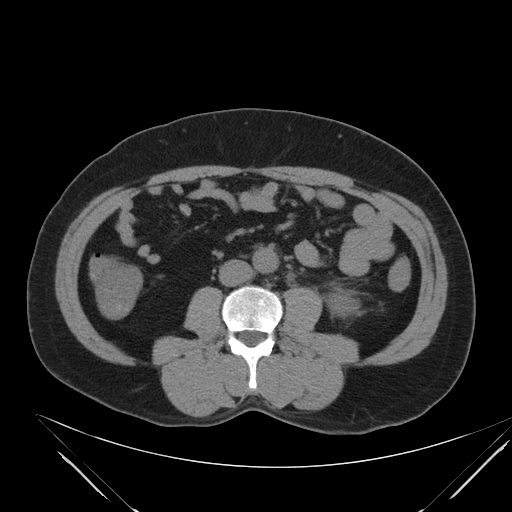
[im 79/112  soft-tissue]
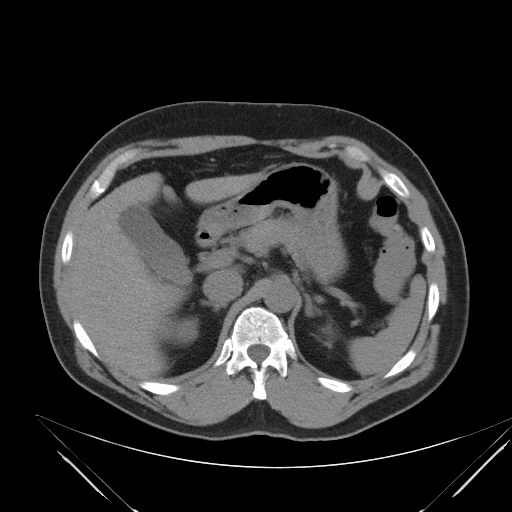
[im 98/112  soft-tissue]
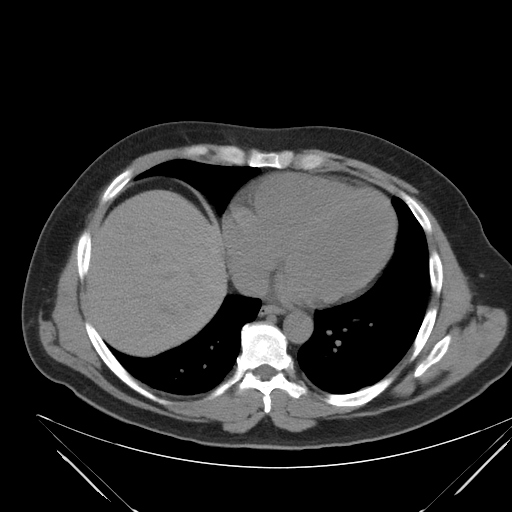

[Series 203: coronals, idose (2) · coronal · 0.45mm/px · 3 of 133 slices shown]
[im 45/133  soft-tissue]
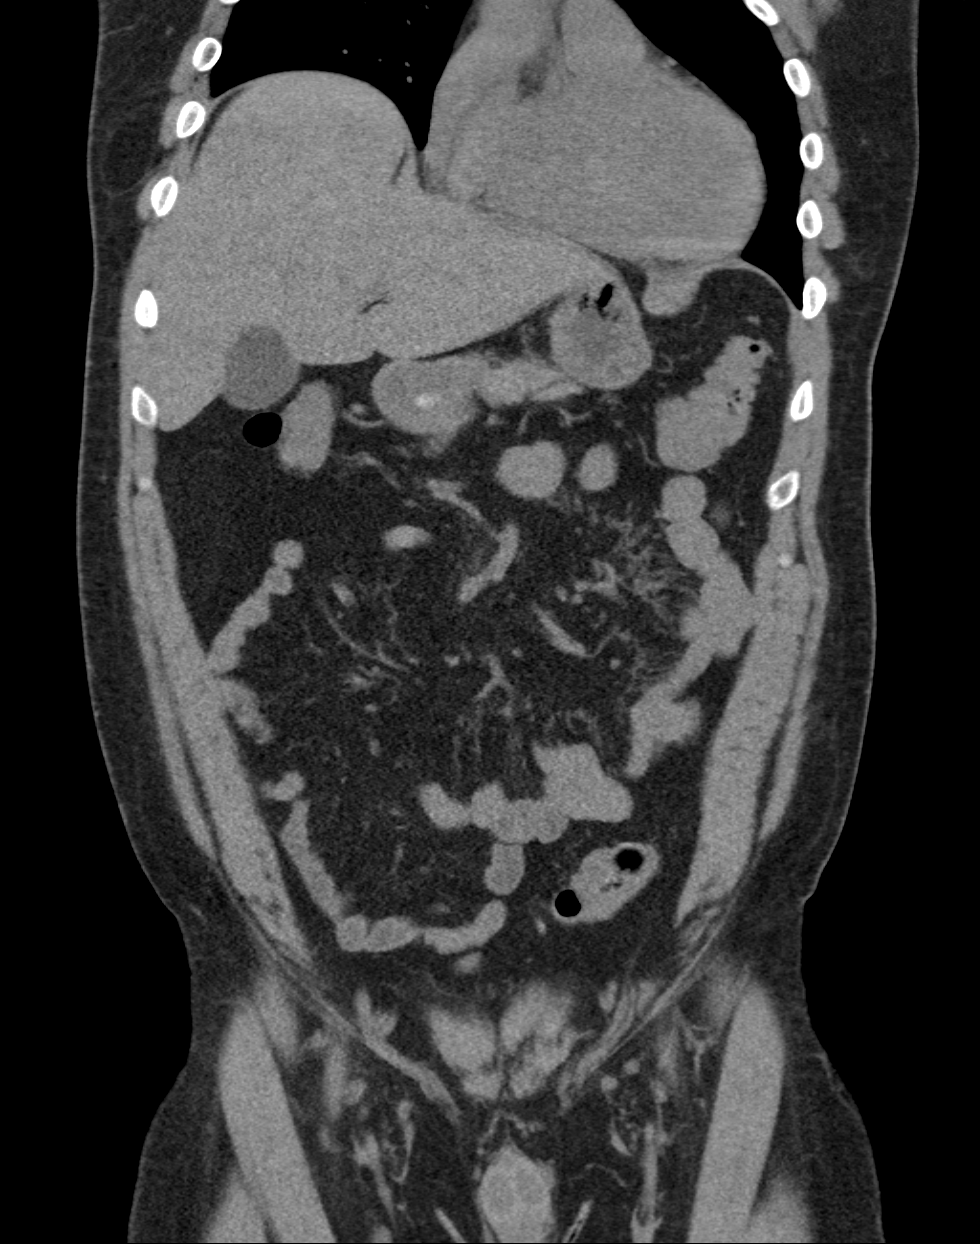
[im 59/133  soft-tissue]
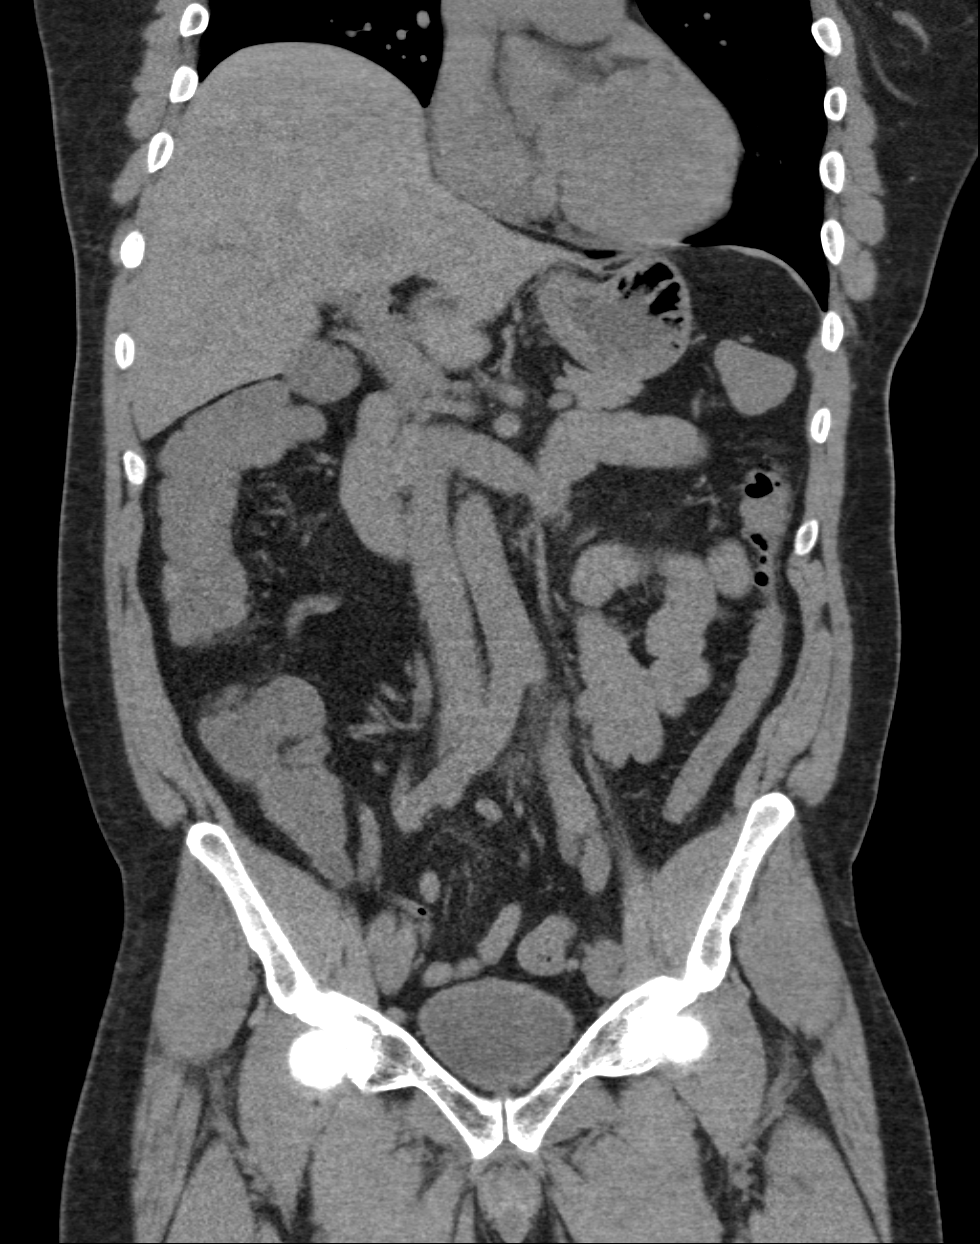
[im 74/133  soft-tissue]
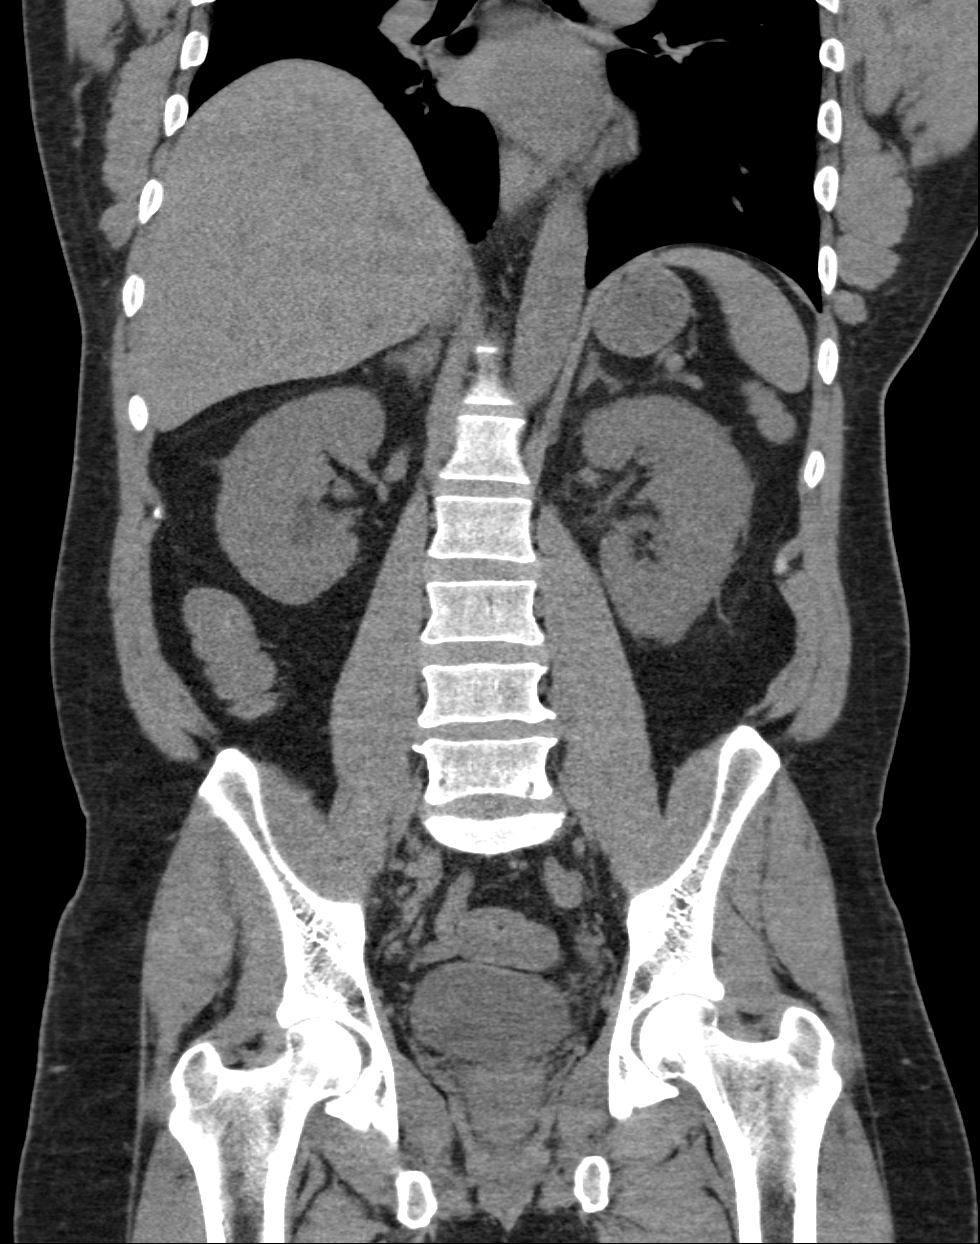

[8 of 46 positions shown; findings below may reference images not displayed]

FINDINGS: Mild dilatation of the left renal collecting system and ureter to
the level of a 6 mm calculus in the distal left ureter, at the
ureterovesical junction. Left perinephric and proximal periureteric
soft tissue stranding. No additional urinary tract calculi are seen
and no hydronephrosis on the right. A 4.4 cm cyst is demonstrated in
the mid to lower right kidney with an interval increase in size.

Unremarkable non contrasted appearance of the liver, spleen,
pancreas, gallbladder, adrenal glands and prostate gland. No
gastrointestinal abnormalities or enlarged lymph nodes. Normal
appearing appendix. Mild right lower lobe atelectasis and minimal
left lower lobe atelectasis. Mild lumbar and lower thoracic spine
degenerative changes. Small umbilical hernia containing fat.
IMPRESSION: 6 mm distal left ureteral calculus at the ureterovesical junction,
causing mild left hydronephrosis and hydroureter.

## 2016-07-14 ENCOUNTER — Telehealth: Payer: Self-pay | Admitting: Internal Medicine

## 2016-07-14 NOTE — Telephone Encounter (Signed)
APT. REMINDER CALL, LMTCB °

## 2016-07-15 ENCOUNTER — Encounter: Payer: Self-pay | Admitting: Internal Medicine

## 2016-07-15 ENCOUNTER — Ambulatory Visit (INDEPENDENT_AMBULATORY_CARE_PROVIDER_SITE_OTHER): Payer: BLUE CROSS/BLUE SHIELD | Admitting: Internal Medicine

## 2016-07-15 ENCOUNTER — Encounter (INDEPENDENT_AMBULATORY_CARE_PROVIDER_SITE_OTHER): Payer: Self-pay

## 2016-07-15 VITALS — BP 172/108 | HR 73 | Temp 97.6°F | Ht 72.0 in | Wt 233.2 lb

## 2016-07-15 DIAGNOSIS — Z800 Family history of malignant neoplasm of digestive organs: Secondary | ICD-10-CM

## 2016-07-15 DIAGNOSIS — Z0000 Encounter for general adult medical examination without abnormal findings: Secondary | ICD-10-CM

## 2016-07-15 DIAGNOSIS — L84 Corns and callosities: Secondary | ICD-10-CM | POA: Diagnosis not present

## 2016-07-15 DIAGNOSIS — Z1211 Encounter for screening for malignant neoplasm of colon: Secondary | ICD-10-CM

## 2016-07-15 DIAGNOSIS — Z79899 Other long term (current) drug therapy: Secondary | ICD-10-CM

## 2016-07-15 DIAGNOSIS — M1000 Idiopathic gout, unspecified site: Secondary | ICD-10-CM

## 2016-07-15 DIAGNOSIS — E785 Hyperlipidemia, unspecified: Secondary | ICD-10-CM

## 2016-07-15 DIAGNOSIS — M1A0411 Idiopathic chronic gout, right hand, with tophus (tophi): Secondary | ICD-10-CM

## 2016-07-15 DIAGNOSIS — I10 Essential (primary) hypertension: Secondary | ICD-10-CM | POA: Diagnosis not present

## 2016-07-15 MED ORDER — COLCHICINE 0.6 MG PO TABS: 0.6000 mg | ORAL_TABLET | Freq: Every day | ORAL | 5 refills | Status: DC

## 2016-07-15 MED ORDER — ASPIRIN 81 MG PO CHEW: 81.0000 mg | CHEWABLE_TABLET | Freq: Every day | ORAL | 11 refills | Status: DC

## 2016-07-15 MED ORDER — ALLOPURINOL 300 MG PO TABS: 300.0000 mg | ORAL_TABLET | Freq: Every day | ORAL | 2 refills | Status: DC

## 2016-07-15 MED ORDER — ATORVASTATIN CALCIUM 20 MG PO TABS: ORAL_TABLET | ORAL | 5 refills | Status: DC

## 2016-07-15 MED ORDER — AMLODIPINE-OLMESARTAN 5-20 MG PO TABS: 1.0000 | ORAL_TABLET | Freq: Every day | ORAL | 5 refills | Status: DC

## 2016-07-15 MED FILL — COLCRYS 0.6 MG TABLET: 0.6 | 30 days supply | Qty: 30 | Fill #0

## 2016-07-15 MED FILL — AMLODIPINE-OLMESARTAN 5-20: 5-20 | 30 days supply | Qty: 30 | Fill #0

## 2016-07-15 MED FILL — ALLOPURINOL 300 MG TABLET: 300 | 30 days supply | Qty: 30 | Fill #0

## 2016-07-15 MED FILL — ATORVASTATIN 20 MG TABLET: 20 | 30 days supply | Qty: 30 | Fill #0

## 2016-07-15 NOTE — Assessment & Plan Note (Signed)
He has a significant family history of colon cancer. Reports his sister died from colon cancer at age 54. Patient has never had a colonoscopy. Will refer him to GI for screening colonoscopy.

## 2016-07-15 NOTE — Patient Instructions (Addendum)
General Instructions: - Start Colchicine 0.6 mg daily and Allopurinol 300 mg daily  - You must take both of these or you could have a gout flare - We will check your uric acid level in 1 month. Your goal is less than 4 since you have tophi on your finger - Avoid drinking alcohol and large amounts of red meat - Referral placed to Gastroenterology for colonoscopy. Their office will contact you. - Referral placed to Podiatry. Their office will contact you for an appointment.  - Follow up on May 10th with Dr.Wallace  Please bring your medicines with you each time you come to clinic.  Medicines may include prescription medications, over-the-counter medications, herbal remedies, eye drops, vitamins, or other pills.   Progress Toward Treatment Goals:  Treatment Goal 12/04/2014  Blood pressure unchanged    Self Care Goals & Plans:  Self Care Goal 11/22/2015  Manage my medications take my medicines as prescribed; bring my medications to every visit; refill my medications on time  Monitor my health -  Eat healthy foods drink diet soda or water instead of juice or soda; eat more vegetables; eat foods that are low in salt; eat baked foods instead of fried foods; eat fruit for snacks and desserts  Be physically active -  Meeting treatment goals maintain the current self-care plan    No flowsheet data found.   Care Management & Community Referrals:  Referral 11/03/2013  Referrals made for care management support none needed  Referrals made to community resources none

## 2016-07-15 NOTE — Progress Notes (Signed)
   CC: Follow-up for hypertension  HPI:  [name] is a 56 y.o. man with past medical history as noted below who presents today for follow-up of his hypertension.  HTN: BP today is elevated at 180/100. He reports he ran out of his medications about a month ago. He was on amlodipine-olmesartan 5-20 milligrams daily.  Gout: Uric acid noted to be elevated at 9.8 in May 2017. He was previously on allopurinol 100 mg daily but states he ran out of this medication several months ago. He reports his last flare was 1.5 months ago. He describes getting flares frequently typically in his right thumb, knees, and feet. He has noticed beer worsens his flares. He has cut down significantly on his drinking. 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   Hyperlipidemia: He was briefly on Lipitor 20 mg daily but has been out of his medication for several months now. He denies any muscle aches.  Corn on the left foot: Reports he had a corn on the bottom of his left foot for over a year. He was seeing a podiatrist who took care of this. He reports he was able to take out a sharp piece of the corn which relieved his symptoms temporarily. However, he is now having pain again on the bottom of his foot.  Past Medical History:  Diagnosis Date  . Gout   . Hyperlipidemia   . Hypertension     Review of Systems:   Review of Systems  Constitutional: Negative for chills, fever, malaise/fatigue and weight loss.  HENT: Negative for congestion, sinus pain and sore throat.   Eyes: Negative for blurred vision and redness.  Respiratory: Negative for cough, sputum production, shortness of breath and wheezing.   Cardiovascular: Negative for chest pain, palpitations, orthopnea and leg swelling.  Gastrointestinal: Negative for abdominal pain, blood in stool, constipation, diarrhea, melena, nausea and vomiting.  Genitourinary: Negative for dysuria, frequency, hematuria and urgency.  Musculoskeletal: Negative for joint pain and  myalgias.  Skin: Negative for rash.  Neurological: Negative for dizziness, tingling, sensory change, focal weakness, weakness and headaches.  Psychiatric/Behavioral: Negative for depression. The patient is not nervous/anxious.     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 There is a corn present on the bottom of his left foot with no erythema or swelling present. Some tenderness to palpation of the area. Neuro: alert and oriented x 3  Assessment & Plan:   See Encounters Tab for problem based charting.  Patient discussed with Dr. Josem Kaufmann

## 2016-07-15 NOTE — Assessment & Plan Note (Signed)
Refilled his atorvastatin today.

## 2016-07-15 NOTE — Assessment & Plan Note (Signed)
He has a corn on the bottom of his left foot. Will refer him back to Dr. Harriet Pho with podiatry.

## 2016-07-15 NOTE — Assessment & Plan Note (Signed)
Advised patient to start colchicine 0.6 mg daily and allopurinol 300 mg daily. We extensively discussed the importance of taking both of these medications to prevent gout flares while we are in the process of getting his uric acid level to target range. Willl have him continue this regimen for the next month and then recheck his uric acid level. His uric acid level goal should be less than 4 given he has tophi present. He will need to continue colchicine and allopurinol for an additional 6 weeks once at target. We also discussed the importance of avoiding alcohol and large amounts of red meat as these things can cause flares.

## 2016-07-15 NOTE — Progress Notes (Signed)
Case discussed with Dr. Rivet at the time of the visit.  We reviewed the resident's history and exam and pertinent patient test results.  I agree with the assessment, diagnosis, and plan of care documented in the resident's note. 

## 2016-07-15 NOTE — Assessment & Plan Note (Signed)
BP elevated. Patient has been off of his medications for several months as he ran out of them. Will restart Amlodipine-Olmesartan 5-20 mg daily. He will follow up in 4 weeks for BP recheck and bmet. May need to increase dosage if BP still not controlled.

## 2016-07-22 ENCOUNTER — Encounter: Payer: Self-pay | Admitting: Internal Medicine

## 2016-07-28 ENCOUNTER — Telehealth: Payer: Self-pay

## 2016-07-30 NOTE — Progress Notes (Signed)
Contacting patient for follow up medication help. Unable to reach 

## 2016-08-20 NOTE — Addendum Note (Signed)
Addended by: Neomia DearPOWERS, Denell Cothern E on: 08/20/2016 06:35 PM   Modules accepted: Orders

## 2016-08-21 ENCOUNTER — Ambulatory Visit: Payer: Self-pay

## 2016-08-21 ENCOUNTER — Telehealth: Payer: Self-pay | Admitting: Internal Medicine

## 2016-08-21 NOTE — Telephone Encounter (Signed)
Called dropped when trying to leave appt time on 08/22/2016

## 2016-08-22 ENCOUNTER — Ambulatory Visit: Payer: Self-pay

## 2016-08-28 ENCOUNTER — Ambulatory Visit: Payer: Self-pay

## 2016-09-02 ENCOUNTER — Ambulatory Visit: Payer: Self-pay

## 2016-09-12 ENCOUNTER — Ambulatory Visit (INDEPENDENT_AMBULATORY_CARE_PROVIDER_SITE_OTHER): Payer: BLUE CROSS/BLUE SHIELD | Admitting: Internal Medicine

## 2016-09-12 VITALS — BP 162/104 | HR 71 | Temp 98.3°F | Ht 72.0 in | Wt 223.4 lb

## 2016-09-12 DIAGNOSIS — M1A0411 Idiopathic chronic gout, right hand, with tophus (tophi): Secondary | ICD-10-CM | POA: Diagnosis not present

## 2016-09-12 DIAGNOSIS — Z9114 Patient's other noncompliance with medication regimen: Secondary | ICD-10-CM

## 2016-09-12 DIAGNOSIS — E785 Hyperlipidemia, unspecified: Secondary | ICD-10-CM | POA: Diagnosis not present

## 2016-09-12 DIAGNOSIS — M1000 Idiopathic gout, unspecified site: Secondary | ICD-10-CM

## 2016-09-12 DIAGNOSIS — I10 Essential (primary) hypertension: Secondary | ICD-10-CM

## 2016-09-12 MED ORDER — ATORVASTATIN CALCIUM 20 MG PO TABS: ORAL_TABLET | ORAL | 2 refills | Status: DC

## 2016-09-12 MED ORDER — ALLOPURINOL 300 MG PO TABS: 300.0000 mg | ORAL_TABLET | Freq: Every day | ORAL | 0 refills | Status: DC

## 2016-09-12 MED ORDER — AMLODIPINE-OLMESARTAN 5-20 MG PO TABS: 1.0000 | ORAL_TABLET | Freq: Every day | ORAL | 0 refills | Status: DC

## 2016-09-12 MED ORDER — COLCHICINE 0.6 MG PO TABS: 0.6000 mg | ORAL_TABLET | Freq: Every day | ORAL | 0 refills | Status: DC

## 2016-09-12 MED FILL — ALLOPURINOL 300 MG TABLET: 300 | 30 days supply | Qty: 30 | Fill #0

## 2016-09-12 MED FILL — ATORVASTATIN 20 MG TABLET: 20 | 30 days supply | Qty: 30 | Fill #0

## 2016-09-12 MED FILL — COLCRYS 0.6 MG TABLET: 0.6 | 90 days supply | Qty: 90 | Fill #0

## 2016-09-12 MED FILL — AMLODIPINE-OLMESARTAN 5-20: 5-20 | 30 days supply | Qty: 30 | Fill #0

## 2016-09-12 NOTE — Progress Notes (Signed)
   CC: "I had an appointment scheduled last time I was here."  HPI:  [name] is a 56 y.o. man with history of HTN, gout, and HL who presents for management of HTN.  Please see A&P for status of the patient's chronic medical conditions.  He ran out of all meds about 2-3 weeks ago.  No troubles affording meds, he has just been busy and hasn't stopped by the pharmacy to pick them up.  Past Medical History:  Diagnosis Date  . Gout   . Hyperlipidemia   . Hypertension     Review of Systems:   Review of Systems  Constitutional: Negative for chills and fever.  Respiratory: Negative for cough and shortness of breath.   Cardiovascular: Negative for chest pain and palpitations.  Musculoskeletal: Negative for joint pain and myalgias.   Physical Exam:  Vitals:   09/12/16 0950  BP: (!) 162/104  Pulse: 71  Temp: 98.3 F (36.8 C)  TempSrc: Oral  SpO2: 100%  Weight: 223 lb 6.4 oz (101.3 kg)  Height: 6' (1.829 m)   Physical Exam  Constitutional: He is oriented to person, place, and time. He appears well-developed and well-nourished. No distress.  Cardiovascular: Normal rate and regular rhythm.   Pulmonary/Chest: Effort normal and breath sounds normal.  Musculoskeletal:  Firm enlargement of right 1st MCP, non-tender, normal skin color  Neurological: He is alert and oriented to person, place, and time.  Psychiatric: He has a normal mood and affect. His behavior is normal.    Assessment & Plan:   See Encounters Tab for problem based charting.  Patient discussed with Dr. Rogelia BogaButcher

## 2016-09-12 NOTE — Patient Instructions (Addendum)
You were seen in clinic for follow-up of hypertension and gout. Unfortunately, since you ran out of your medicines we cannot check and see how they've been doing for you. Please come back in one month after you've been taking your medicines regularly.  I have sent 90 day prescriptions are your medicines to [facility]. I have not ordered any refills at this time because you need to come back to the clinic to get checked while you've been taking them.

## 2016-09-12 NOTE — Assessment & Plan Note (Addendum)
Started on on colchicine 0.6 mg daily and allopurinol 300 mg daily two months ago.  Uric acid 9.8 when last checked 1 year ago. His last flare was many months ago.  Gout with tophi on right hand.  Nonadherent with uric acid lowering medicine, cannot assess treatment response today. Goal uric acid of less than 5 with his tophus.  -Continue allopurinol and colchicine -Return to clinic in one month -Check CMP and uric acid level at follow-up

## 2016-09-12 NOTE — Assessment & Plan Note (Addendum)
BP Readings from Last 3 Encounters:  09/12/16 (!) 162/104  07/15/16 (!) 172/108  11/22/15 (!) 155/116   Lab Results  Component Value Date   CREATININE 1.20 11/22/2015   Lab Results  Component Value Date   K 4.6 11/22/2015    Current medications: amlodipine-olmesartan 5-20 mg daily  He was last seen on July 15 2016. At that time his blood pressure was elevated to 180/100 and he had not been taking any antihypertensives for several months. He was restarted on amlodipine-olmesartan 5-20 milligrams daily, which he said he took for about a month but has been nonadherent now for several weeks.  Assessment BP goal: <140/90 BP control: uncontrolled  Uncontrolled secondary to medication nonadherence.  Plan Medications: Continue current prescriptions Other: -Emphasized the importance of medication adherence -[facility] He had previously been getting prescriptions [facility] pharmacy, but now has insurance. -Return to clinic in one month for blood pressure check after taking meds regularly -CMP at follow-up

## 2016-09-12 NOTE — Progress Notes (Signed)
Internal Medicine Clinic Attending  Case discussed with Dr. O'Sullivan at the time of the visit.  We reviewed the resident's history and exam and pertinent patient test results.  I agree with the assessment, diagnosis, and plan of care documented in the resident's note. 

## 2016-10-10 ENCOUNTER — Ambulatory Visit: Payer: Self-pay

## 2016-10-13 MED FILL — ALLOPURINOL 300 MG TABLET: 300 | 30 days supply | Qty: 30 | Fill #1

## 2016-10-13 MED FILL — AMLODIPINE-OLMESARTAN 5-20: 5-20 | 30 days supply | Qty: 30 | Fill #1

## 2016-10-13 MED FILL — ATORVASTATIN 20 MG TABLET: 20 | 30 days supply | Qty: 30 | Fill #1

## 2016-10-22 ENCOUNTER — Ambulatory Visit (INDEPENDENT_AMBULATORY_CARE_PROVIDER_SITE_OTHER): Payer: BLUE CROSS/BLUE SHIELD | Admitting: Internal Medicine

## 2016-10-22 ENCOUNTER — Encounter: Payer: Self-pay | Admitting: Internal Medicine

## 2016-10-22 VITALS — BP 152/102 | HR 72 | Temp 97.8°F | Ht 73.0 in | Wt 223.3 lb

## 2016-10-22 DIAGNOSIS — E7439 Other disorders of intestinal carbohydrate absorption: Secondary | ICD-10-CM | POA: Diagnosis not present

## 2016-10-22 DIAGNOSIS — E785 Hyperlipidemia, unspecified: Secondary | ICD-10-CM

## 2016-10-22 DIAGNOSIS — N529 Male erectile dysfunction, unspecified: Secondary | ICD-10-CM | POA: Diagnosis not present

## 2016-10-22 DIAGNOSIS — Z0000 Encounter for general adult medical examination without abnormal findings: Secondary | ICD-10-CM

## 2016-10-22 DIAGNOSIS — M1A00X1 Idiopathic chronic gout, unspecified site, with tophus (tophi): Secondary | ICD-10-CM | POA: Diagnosis not present

## 2016-10-22 DIAGNOSIS — Z79899 Other long term (current) drug therapy: Secondary | ICD-10-CM | POA: Diagnosis not present

## 2016-10-22 DIAGNOSIS — I10 Essential (primary) hypertension: Secondary | ICD-10-CM

## 2016-10-22 DIAGNOSIS — R7303 Prediabetes: Secondary | ICD-10-CM | POA: Insufficient documentation

## 2016-10-22 MED ORDER — SILDENAFIL CITRATE 50 MG PO TABS: 50.0000 mg | ORAL_TABLET | ORAL | 1 refills | Status: DC | PRN

## 2016-10-22 MED ORDER — AMLODIPINE-OLMESARTAN 5-40 MG PO TABS: 1.0000 | ORAL_TABLET | Freq: Every day | ORAL | 0 refills | Status: DC

## 2016-10-22 MED FILL — AMLODIPINE-OLMESARTAN 5-40: 5-40 | 90 days supply | Qty: 90 | Fill #0

## 2016-10-22 MED FILL — SILDENAFIL 50 MG TABLET: 50 | 30 days supply | Qty: 5 | Fill #0

## 2016-10-22 NOTE — Assessment & Plan Note (Signed)
Patient's last BMP showed elevated glucose. A1C ordered on blood work today and patient will be contacted with results.   Continue to monitor this problem on follow up with PCP in 2 months.

## 2016-10-22 NOTE — Assessment & Plan Note (Signed)
The patient had elevated BP on two separate readings in clinic today. Goal of <140/90. The patient reports compliance with current medications and denies any side effects from current regimen. Will change his BP medication today to amlodipine-olmesartan, 5-40 mg tablet. Patient was instructed to follow up with PCP in 2 months for BP recheck.  Summary: -Increase amlodipine-olmesartan from 5-20mg  tablet to 5-40 mg tablet.

## 2016-10-22 NOTE — Assessment & Plan Note (Signed)
Several months of decreased tumescence and difficulty obtaining erections. Also decreased nocturnal erections. No depression, reports he does have desire, wants to improve intimacy with his wife. On exam he has normal sexual characteristics, hair distribution, normal appearing penis, and normal testicular size. I told the patient that I doubt testosterone deficiency, and would not recommend checking. Rather probably related to microvascular disease given long standing hypertension. We talked about pharmacologic options and other options like a pump. He wants to try viagra, which is reasonable. No contraindications, I warned against headache and other side effects. Plan is to try Sildenafil 50mg  prn, #5, he will let us know if the cost is prohibitive.

## 2016-10-22 NOTE — Assessment & Plan Note (Signed)
The patient is getting blood work today and due for HIV/HepC screening per chart review. The patient agreed to have these lab tests added to his blood work and he will be called with the results of these tests.

## 2016-10-22 NOTE — Progress Notes (Signed)
   CC: HTN and Gout follow up  HPI:  [name] is a 56 y.o. with a history of HTN, HLD, and gout who is presenting today for follow up and management of his chronic medical conditions. Per chart review at the last visit the patient ran out of HTN and gout medications and had, therefore, not been taking medications for a while. He was given a 90-day prescription and told to follow up to make sure that the regimen that was restarted was sufficient.   Since his last visit the patient reports being able to take all medications as previously prescribed at his last visit. He reports feeling well and states that he hasn't had any gout pain since his last visit. He reports some diarrhea with colchicine, which resolved after taking the medicine for about a week. He denies any headaches, chest pain, SOB, changes in vision, leg swelling, and focal weakness.  Past Medical History:  Diagnosis Date  . Gout   . Hyperlipidemia   . Hypertension    Review of Systems:   Denies fevers, unintentional weight loss, abdominal pain, dark, tarry stools, pain with urination, change in color of urine, and difficulties with urination.  Physical Exam:  Vitals:   10/22/16 1053 10/22/16 1056  BP: (!) 141/96 (!) 152/102  Pulse: 80 72  Temp: 97.8 F (36.6 C)   TempSrc: Oral   SpO2: 100%   Weight: 223 lb 4.8 oz (101.3 kg)   Height: 6\' 1"  (1.854 m)    Physical Exam  Cardiovascular: Normal rate, regular rhythm and normal heart sounds.  Exam reveals no friction rub.   No murmur heard. No pitting edema  Pulmonary/Chest: Effort normal and breath sounds normal. No respiratory distress. He has no wheezes.  Abdominal: Soft. Bowel sounds are normal. He exhibits no distension. There is no tenderness.  Musculoskeletal:  Small, hard mass on 1st MCP join on R hand. Non-tender to palpation without overlying erythema.   Skin: Skin is warm and dry. No erythema.    Assessment & Plan:   See Encounters Tab for problem  based charting.  Patient seen with Dr. Oswaldo DoneVincent.

## 2016-10-22 NOTE — Patient Instructions (Addendum)
Thank you for seeing us today.   Your blood pressure was elevated on two checks during clinic today. We have changed your blood pressure medication. Please STOP taking your old blood pressure medicine (Azor 5-20 mg tablet) and START taking your new blood pressure medicine (Azor 5-40 mg tablet). Please let us know if you have any trouble with this medication.  Continue taking your gout medication as previously prescribed.  Please follow up with your PCP Dr. Earlene PlaterWallace in about 2 months for BP recheck and medication refill.

## 2016-10-22 NOTE — Assessment & Plan Note (Addendum)
The patient denies any new tophi and states that the tophi he noticed at last visit no longer bother him. The patient reports being able to take medication without difficulty. His side effect of diarrhea with colchicine initiation has now resolved. Uric acid levels obtained today. The patient was instructed to continue taking his regimen of allopurinol and colchicine until follow up with PCP Dr. Earlene PlaterWallace in 2 months.   Summary:  Continue allopurinol, 300 mg daily and colchicine 0.6 mg daily. Check Uric acid and follow up with PCP.

## 2016-10-23 LAB — BMP8+ANION GAP
Anion Gap: 15 mmol/L (ref 10.0–18.0)
BUN / CREAT RATIO: 18 (ref 9–20)
BUN: 23 mg/dL (ref 6–24)
CO2: 24 mmol/L (ref 20–29)
Calcium: 9.4 mg/dL (ref 8.7–10.2)
Chloride: 104 mmol/L (ref 96–106)
Creatinine, Ser: 1.27 mg/dL (ref 0.76–1.27)
GFR calc non Af Amer: 64 mL/min/{1.73_m2} (ref >59)
GFR, EST AFRICAN AMERICAN: 74 mL/min/{1.73_m2} (ref >59)
GLUCOSE: 104 mg/dL — AB (ref 65–99)
POTASSIUM: 4.9 mmol/L (ref 3.5–5.2)
SODIUM: 143 mmol/L (ref 134–144)

## 2016-10-23 LAB — HEMOGLOBIN A1C
Est. average glucose Bld gHb Est-mCnc: 117 mg/dL
Hgb A1c MFr Bld: 5.7 % — ABNORMAL HIGH (ref 4.8–5.6)

## 2016-10-23 LAB — URIC ACID: URIC ACID: 4.8 mg/dL (ref 3.7–8.6)

## 2016-10-23 LAB — HIV ANTIBODY (ROUTINE TESTING W REFLEX): HIV Screen 4th Generation wRfx: NONREACTIVE

## 2016-10-23 LAB — HEPATITIS C ANTIBODY: Hep C Virus Ab: <0.1 s/co ratio (ref 0.0–0.9)

## 2016-10-23 NOTE — Progress Notes (Signed)
Internal Medicine Clinic Attending  I saw and evaluated the patient.  I personally confirmed the key portions of the history and exam documented by Dr. Nedrud and I reviewed pertinent patient test results.  The assessment, diagnosis, and plan were formulated together and I agree with the documentation in the resident's note.  

## 2016-10-24 ENCOUNTER — Telehealth: Payer: Self-pay | Admitting: Internal Medicine

## 2016-10-24 NOTE — Telephone Encounter (Signed)
The patient was called with results of his blood work. He was informed that his BMP was within normal limits, his GFR was similar to what it has been in the past, and his uric acid levels were at goal. The patient was also told his HIV and HepC testing was negative. We talked about his recent A1C being within the pre-diabetic range and higher than what we would normally want. The patient was told that diabetes, especially in someone with HTN, is bad for the kidneys. We talked about his diet and the patient states that he drinks about 1, non-diet soda per day. I encouraged lifestyle modifications to help control his blood sugar. He was instructed that decreasing soda intake and decreasing intake of other sugary foods can help control his blood sugar. The patient set goal of decreasing his soda intake so that he no longer drinks one soda per day.  The patient reports that he was unable to get his new BP medication prescribed because the pharmacy ran out. He was called by the pharmacy earlier today and was told the medication was ready. He plans to pick up medication and start taking new dose later today.

## 2016-11-11 NOTE — Addendum Note (Signed)
Addended by: Neomia DearPOWERS, Cadince Hilscher E on: 11/11/2016 06:31 PM   Modules accepted: Orders

## 2016-12-25 ENCOUNTER — Encounter: Payer: Self-pay | Admitting: Internal Medicine

## 2017-01-01 ENCOUNTER — Encounter: Payer: Self-pay | Admitting: Internal Medicine

## 2017-01-01 ENCOUNTER — Ambulatory Visit (INDEPENDENT_AMBULATORY_CARE_PROVIDER_SITE_OTHER): Payer: BLUE CROSS/BLUE SHIELD | Admitting: Internal Medicine

## 2017-01-01 VITALS — BP 138/98 | HR 76 | Temp 97.6°F | Ht 73.0 in | Wt 221.7 lb

## 2017-01-01 DIAGNOSIS — Z800 Family history of malignant neoplasm of digestive organs: Secondary | ICD-10-CM

## 2017-01-01 DIAGNOSIS — E785 Hyperlipidemia, unspecified: Secondary | ICD-10-CM

## 2017-01-01 DIAGNOSIS — R7303 Prediabetes: Secondary | ICD-10-CM | POA: Diagnosis not present

## 2017-01-01 DIAGNOSIS — M1A0410 Idiopathic chronic gout, right hand, without tophus (tophi): Secondary | ICD-10-CM

## 2017-01-01 DIAGNOSIS — M1000 Idiopathic gout, unspecified site: Secondary | ICD-10-CM

## 2017-01-01 DIAGNOSIS — Z23 Encounter for immunization: Secondary | ICD-10-CM | POA: Diagnosis not present

## 2017-01-01 DIAGNOSIS — Z79899 Other long term (current) drug therapy: Secondary | ICD-10-CM

## 2017-01-01 DIAGNOSIS — L309 Dermatitis, unspecified: Secondary | ICD-10-CM

## 2017-01-01 DIAGNOSIS — N529 Male erectile dysfunction, unspecified: Secondary | ICD-10-CM

## 2017-01-01 DIAGNOSIS — Z0000 Encounter for general adult medical examination without abnormal findings: Secondary | ICD-10-CM

## 2017-01-01 DIAGNOSIS — I10 Essential (primary) hypertension: Secondary | ICD-10-CM

## 2017-01-01 MED ORDER — AMLODIPINE-OLMESARTAN 5-40 MG PO TABS: 1.0000 | ORAL_TABLET | Freq: Every day | ORAL | 1 refills | Status: DC

## 2017-01-01 MED ORDER — COLCHICINE 0.6 MG PO TABS: 0.6000 mg | ORAL_TABLET | Freq: Every day | ORAL | 1 refills | Status: DC

## 2017-01-01 MED ORDER — SILDENAFIL CITRATE 50 MG PO TABS
50.0000 mg | ORAL_TABLET | ORAL | 1 refills | Status: DC | PRN
Start: 2017-01-01 — End: 2017-06-17

## 2017-01-01 MED ORDER — TRIAMCINOLONE ACETONIDE 0.1 % EX OINT: 1.0000 "application " | TOPICAL_OINTMENT | Freq: Two times a day (BID) | CUTANEOUS | 0 refills | Status: DC

## 2017-01-01 MED ORDER — ALLOPURINOL 300 MG PO TABS: 300.0000 mg | ORAL_TABLET | Freq: Every day | ORAL | 1 refills | Status: DC

## 2017-01-01 MED ORDER — ATORVASTATIN CALCIUM 20 MG PO TABS: ORAL_TABLET | ORAL | 1 refills | Status: DC

## 2017-01-01 MED FILL — SILDENAFIL CITRATE 50 MG TA: 50 | 30 days supply | Qty: 5 | Fill #0

## 2017-01-01 MED FILL — ATORVASTATIN 20 MG TABLET: 20 | 90 days supply | Qty: 90 | Fill #0

## 2017-01-01 MED FILL — TRIAMCINOLONE 0.1% OINTMENT: 0.1 | 10 days supply | Qty: 30 | Fill #0

## 2017-01-01 MED FILL — ALLOPURINOL 300 MG TABLET: 300 | 90 days supply | Qty: 90 | Fill #0

## 2017-01-01 NOTE — Patient Instructions (Signed)
Thank you for coming to see me today. It was a pleasure. Today we talked about:   Blood pressure - I have refilled your medication.  You will take 1 pill called Azor which contains 2 medications in it. - please take this medication daily  Gout: - I have refilled your allopurinol and colchicine  Eczema: - I have given you a prescription for triamcinolone ointment.  Please follow-up with me in 3 months for blood pressure follow up.  If you have any questions or concerns, please do not hesitate to call the office at [phone].  Take Care,   Gwynn Burly, DO

## 2017-01-01 NOTE — Progress Notes (Signed)
   CC: here for HTN follow up  HPI:  [name] is a 56 y.o. man with a past medical history listed below here today for follow up of his HLD, HTN, gout, eczema and ED.   For details of today's visit and the status of his chronic medical issues please refer to the assessment and plan.   Past Medical History:  Diagnosis Date  . Gout   . Hyperlipidemia   . Hypertension    Review of Systems:  Please see pertinent ROS reviewed in HPI and problem based charting.   Physical Exam:  Vitals:   01/01/17 1437  BP: (!) 138/98  Pulse: 76  Temp: 97.6 F (36.4 C)  TempSrc: Oral  SpO2: 100%  Weight: 221 lb 11.2 oz (100.6 kg)  Height:  (1.854 m)   General: sitting in chair, NAD HEENT: NCAT, EOMI, no scleral icterus Cardiac: RRR Pulm: normal effort Ext: warm and well perfused, no pedal edema.  Small, hard mass on right 1st MCP.  Improving per patient.  Bilateral upper extremity eczema of wrists. Neuro: alert and oriented X3, cranial nerves II-XII grossly intact   Assessment & Plan:   See Encounters Tab for problem based charting.  Patient discussed with Dr. Cleda Daub .  Essential hypertension BP Readings from Last 3 Encounters:  01/01/17 (!) 138/98  10/22/16 (!) 152/102  09/12/16 (!) 162/104   A: BP today is improved from July with reading of 138/98.  Has not been checking BP at home.  Ran out of meds 2 days ago and reports he needs a refill.  No complaints otherwise.  P: - refilled his amlodipine-olmesartan combination 5-40mg  daily - RTC in 3 months for follow up  - Advised patient on regular exercise, low salt intake, and adherence with medications  Chronic eczema A: History of eczema on wrists and abdomen previously controlled with triamcinolone ointment for which he requests a refill.  Exam is consistent with eczema of bilateral wrists.  P: - Continue topical triamcinolone ointment as needed.  Erectile dysfunction A: Erectile dysfunction currently treated  with Viagra  as needed.  Reports taking 30-45 minutes prior to sexual activity.  Reports that Viagra is effective but takes too long to take effect.  P: - Continue Viagra  as needed, new refill sent - Advised patient to take medication 60-90 minutes prior to sexual activity   Gout A: Denies any new gout symptoms or tophi.  The area at his right first MCP joint is improving per his report.  He is asking for refills of his colchicine and allopurinol.  Uric acid level checked in July was 4.8.  Goal less than 5 with his evidence of tophi.  P: - Continue allopurinol  daily and colchicine 0.6mg  daily.  With tophi, will continue colchicine for 6 months from obtaining goal uric acid level (approximately Jan 2019)  Health care maintenance Patient was given flu vaccine today and referred to GI for screening colonoscopy due to family hx of colon cancer in sister.  He denies any concerning symptoms such as melena, BRPBR, or change in bowel habits.  Hyperlipidemia A: On atorvastatin  daily.  P: - refilled medication and will continue with atorvastatin  daily  Pre-diabetes A: A1c in Pre-DM range when checked at clinic visit in July.  P: - encouraged lifestyle modifications - repeat A1c in 1 year.

## 2017-01-02 NOTE — Assessment & Plan Note (Signed)
A: History of eczema on wrists and abdomen previously controlled with triamcinolone ointment for which he requests a refill.  Exam is consistent with eczema of bilateral wrists.  P: - Continue topical triamcinolone ointment as needed.

## 2017-01-02 NOTE — Assessment & Plan Note (Signed)
A: Denies any new gout symptoms or tophi.  The area at his right first MCP joint is improving per his report.  He is asking for refills of his colchicine and allopurinol.  Uric acid level checked in July was 4.8.  Goal less than 5 with his evidence of tophi.  P: - Continue allopurinol  daily and colchicine 0.6mg  daily.  With tophi, will continue colchicine for 6 months from obtaining goal uric acid level (approximately Jan 2019)

## 2017-01-02 NOTE — Assessment & Plan Note (Signed)
Patient was given flu vaccine today and referred to GI for screening colonoscopy due to family hx of colon cancer in sister.  He denies any concerning symptoms such as melena, BRPBR, or change in bowel habits.

## 2017-01-02 NOTE — Assessment & Plan Note (Signed)
A: Erectile dysfunction currently treated with Viagra  as needed.  Reports taking 30-45 minutes prior to sexual activity.  Reports that Viagra is effective but takes too long to take effect.  P: - Continue Viagra  as needed, new refill sent - Advised patient to take medication 60-90 minutes prior to sexual activity

## 2017-01-02 NOTE — Progress Notes (Signed)
Internal Medicine Clinic Attending  Case discussed with Dr. Wallace at the time of the visit.  We reviewed the resident's history and exam and pertinent patient test results.  I agree with the assessment, diagnosis, and plan of care documented in the resident's note.  

## 2017-01-02 NOTE — Assessment & Plan Note (Signed)
BP Readings from Last 3 Encounters:  01/01/17 (!) 138/98  10/22/16 (!) 152/102  09/12/16 (!) 162/104   A: BP today is improved from July with reading of 138/98.  Has not been checking BP at home.  Ran out of meds 2 days ago and reports he needs a refill.  No complaints otherwise.  P: - refilled his amlodipine-olmesartan combination 5-40mg  daily - RTC in 3 months for follow up  - Advised patient on regular exercise, low salt intake, and adherence with medications

## 2017-01-02 NOTE — Assessment & Plan Note (Signed)
A: A1c in Pre-DM range when checked at clinic visit in July.  P: - encouraged lifestyle modifications - repeat A1c in 1 year.

## 2017-01-02 NOTE — Assessment & Plan Note (Signed)
A: On atorvastatin  daily.  P: - refilled medication and will continue with atorvastatin  daily

## 2017-01-09 ENCOUNTER — Encounter: Payer: Self-pay | Admitting: Internal Medicine

## 2017-01-28 NOTE — Addendum Note (Signed)
Addended by: Neomia DearPOWERS, Tarvares Lant E on: 01/28/2017 05:39 PM   Modules accepted: Orders

## 2017-03-21 IMAGING — DX DG CHEST 2V
2 series · 2 of 2 positions shown · non-contrast
Comparison: Radiograph dated 11/11/2010 and 10/11/2010

CLINICAL DATA: 55-year-old male with chest pain

EXAM:
CHEST  2 VIEW

[chest pa]
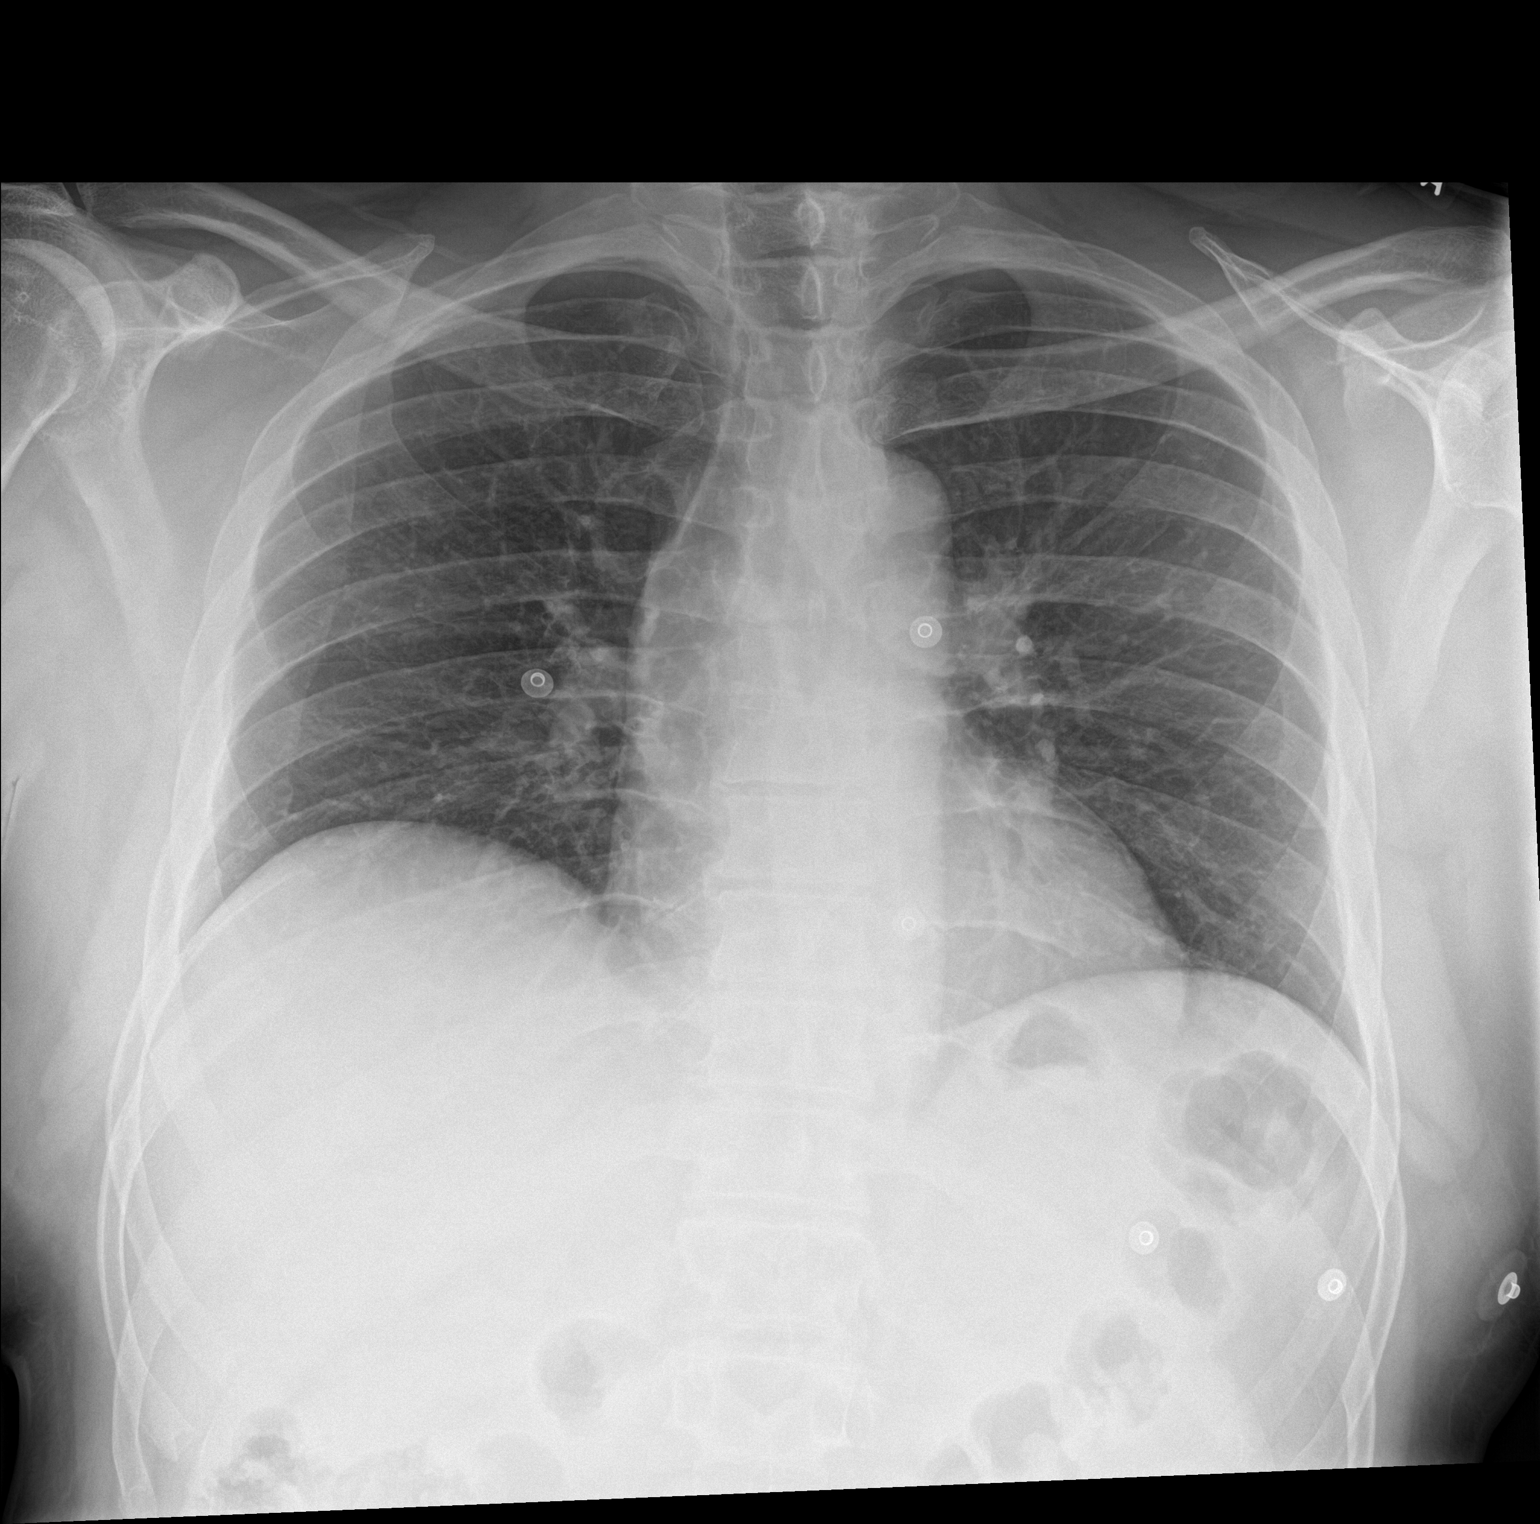

[chest lat]
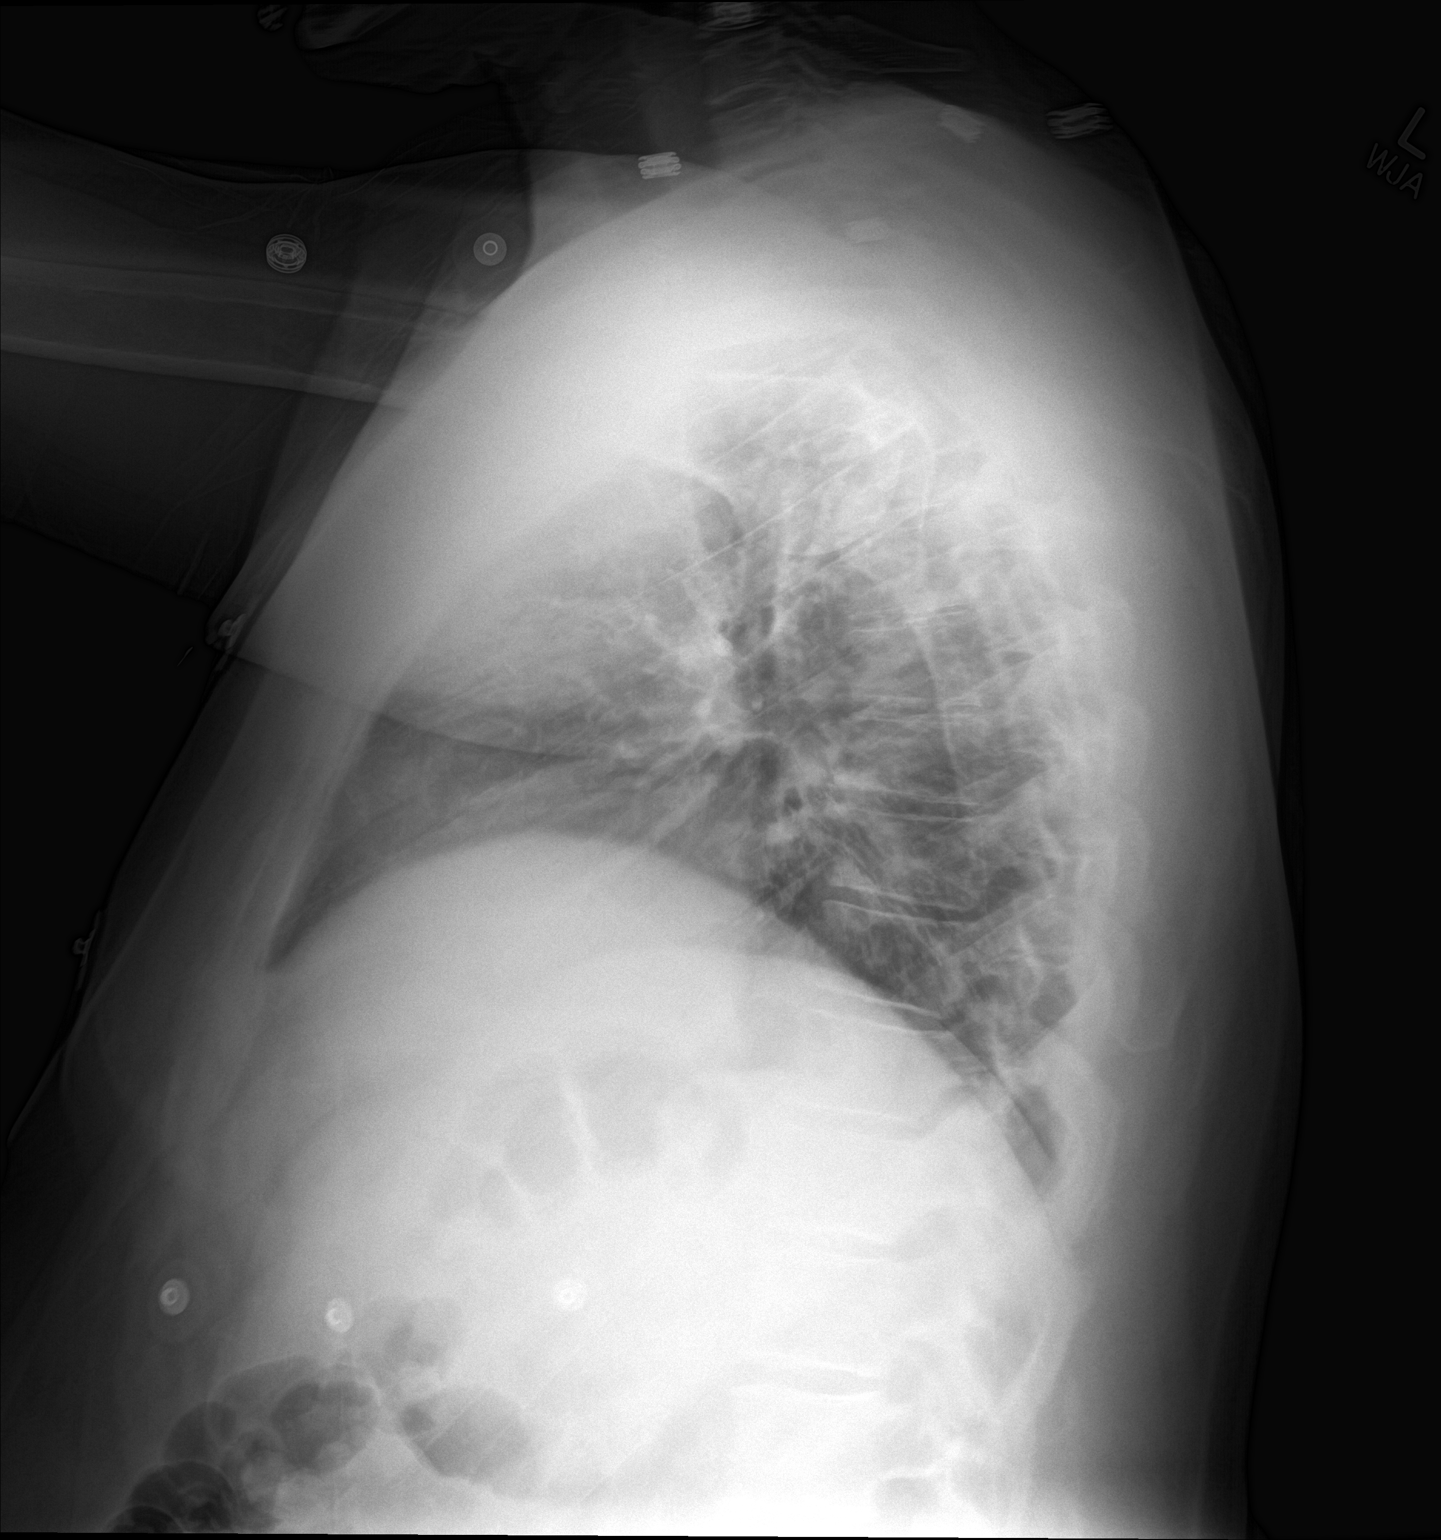

[2 of 2 positions shown; findings below may reference images not displayed]

FINDINGS: The heart size and mediastinal contours are within normal limits.
Both lungs are clear. The visualized skeletal structures are
unremarkable.
IMPRESSION: No active cardiopulmonary disease.

## 2017-04-02 ENCOUNTER — Encounter: Payer: Self-pay | Admitting: Internal Medicine

## 2017-04-13 MED FILL — AMLODIPINE-OLMESARTAN 5-40: 5-40 | 30 days supply | Qty: 30 | Fill #0

## 2017-05-21 ENCOUNTER — Encounter: Payer: Self-pay | Admitting: Internal Medicine

## 2017-06-17 ENCOUNTER — Encounter (INDEPENDENT_AMBULATORY_CARE_PROVIDER_SITE_OTHER): Payer: Self-pay

## 2017-06-17 ENCOUNTER — Ambulatory Visit: Payer: BLUE CROSS/BLUE SHIELD | Admitting: Internal Medicine

## 2017-06-17 ENCOUNTER — Encounter: Payer: Self-pay | Admitting: Internal Medicine

## 2017-06-17 ENCOUNTER — Other Ambulatory Visit: Payer: Self-pay

## 2017-06-17 VITALS — BP 139/99 | HR 96 | Temp 97.6°F | Wt 230.6 lb

## 2017-06-17 DIAGNOSIS — M21962 Unspecified acquired deformity of left lower leg: Secondary | ICD-10-CM

## 2017-06-17 DIAGNOSIS — E785 Hyperlipidemia, unspecified: Secondary | ICD-10-CM | POA: Diagnosis not present

## 2017-06-17 DIAGNOSIS — M1000 Idiopathic gout, unspecified site: Secondary | ICD-10-CM

## 2017-06-17 DIAGNOSIS — M109 Gout, unspecified: Secondary | ICD-10-CM

## 2017-06-17 DIAGNOSIS — I10 Essential (primary) hypertension: Secondary | ICD-10-CM

## 2017-06-17 DIAGNOSIS — Z79899 Other long term (current) drug therapy: Secondary | ICD-10-CM | POA: Diagnosis not present

## 2017-06-17 DIAGNOSIS — L84 Corns and callosities: Secondary | ICD-10-CM | POA: Diagnosis not present

## 2017-06-17 DIAGNOSIS — M79672 Pain in left foot: Secondary | ICD-10-CM | POA: Diagnosis not present

## 2017-06-17 DIAGNOSIS — N529 Male erectile dysfunction, unspecified: Secondary | ICD-10-CM

## 2017-06-17 MED ORDER — SILDENAFIL CITRATE 50 MG PO TABS: 50.0000 mg | ORAL_TABLET | ORAL | 1 refills | Status: DC | PRN

## 2017-06-17 MED ORDER — AMLODIPINE-OLMESARTAN 5-40 MG PO TABS: 1.0000 | ORAL_TABLET | Freq: Every day | ORAL | 1 refills | Status: DC

## 2017-06-17 MED ORDER — ALLOPURINOL 300 MG PO TABS: 300.0000 mg | ORAL_TABLET | Freq: Every day | ORAL | 1 refills | Status: DC

## 2017-06-17 MED ORDER — ATORVASTATIN CALCIUM 20 MG PO TABS: ORAL_TABLET | ORAL | 1 refills | Status: DC

## 2017-06-17 MED FILL — ATORVASTATIN 20 MG TABLET: 20 | 30 days supply | Qty: 30 | Fill #1

## 2017-06-17 MED FILL — SILDENAFIL CITRATE 50 MG TA: 50 | 30 days supply | Qty: 5 | Fill #0

## 2017-06-17 MED FILL — AMLODIPINE-OLMESARTAN 5-40: 5-40 | 30 days supply | Qty: 30 | Fill #1

## 2017-06-17 MED FILL — ALLOPURINOL 300 MG TABLET: 300 | 30 days supply | Qty: 30 | Fill #1

## 2017-06-17 NOTE — Patient Instructions (Addendum)
Thank you for coming to see me today. It was a pleasure. Today we talked about:   I have refilled your medications.  For your foot pain, please see if the drug store has any special padding for calluses and./or corns  I have also referred you to the podiatrist.  Please follow-up with me  in 6months.  If you have any questions or concerns, please do not hesitate to call the office at [phone].  Take Care,   Gwynn BurlyAndrew Alaylah Heatherington, DO  Corns and Calluses Corns are small areas of thickened skin that occur on the top, sides, or tip of a toe. They contain a cone-shaped core with a point that can press on a nerve below. This causes pain. Calluses are areas of thickened skin that can occur anywhere on the body including hands, fingers, palms, soles of the feet, and heels.Calluses are usually larger than corns. What are the causes? Corns and calluses are caused by rubbing (friction) or pressure, such as from shoes that are too tight or do not fit properly. What increases the risk? Corns are more likely to develop in people who have toe deformities, such as hammer toes. Since calluses can occur with friction to any area of the skin, calluses are more likely to develop in people who:  Work with their hands.  Wear shoes that fit poorly, shoes that are too tight, or shoes that are high-heeled.  Have toes deformities.  What are the signs or symptoms? Symptoms of a corn or callus include:  A hard growth on the skin.  Pain or tenderness under the skin.  Redness and swelling.  Increased discomfort while wearing tight-fitting shoes.  How is this diagnosed? Corns and calluses may be diagnosed with a medical history and physical exam. How is this treated? Corns and calluses may be treated with:  Removing the cause of the friction or pressure. This may include: ? Changing your shoes. ? Wearing shoe inserts (orthotics) or other protective layers in your shoes, such as a corn pad. ? Wearing  gloves.  Medicines to help soften skin in the hardened, thickened areas.  Reducing the size of the corn or callus by removing the dead layers of skin.  Antibiotic medicines to treat infection.  Surgery, if a toe deformity is the cause.  Follow these instructions at home:  Take medicines only as directed by your health care provider.  If you were prescribed an antibiotic, finish all of it even if you start to feel better.  Wear shoes that fit well. Avoid wearing high-heeled shoes and shoes that are too tight or too loose.  Wear any padding, protective layers, gloves, or orthotics as directed by your health care provider.  Soak your hands or feet and then use a file or pumice stone to soften your corn or callus. Do this as directed by your health care provider.  Check your corn or callus every day for signs of infection. Watch for: ? Redness, swelling, or pain. ? Fluid, blood, or pus. Contact a health care provider if:  Your symptoms do not improve with treatment.  You have increased redness, swelling, or pain at the site of your corn or callus.  You have fluid, blood, or pus coming from your corn or callus.  You have new symptoms. This information is not intended to replace advice given to you by your health care provider. Make sure you discuss any questions you have with your health care provider. Document Released: 01/05/2004 Document Revised: 10/19/2015  Document Reviewed: 03/27/2014 Elsevier Interactive Patient Education  [employer].

## 2017-06-17 NOTE — Assessment & Plan Note (Signed)
Patient doing well on Viagra 50mg  as needed.  Reports good effect with medication.  Plan: - Refill provided today.

## 2017-06-17 NOTE — Progress Notes (Signed)
   CC: here for left foot pain and BP follow up  HPI:  [name] is a 57 y.o. man with a past medical history listed below here today for follow up of his BP and left foot pain.   For details of today's visit and the status of his chronic medical issues please refer to the assessment and plan.   Past Medical History:  Diagnosis Date  . Gout   . Hyperlipidemia   . Hypertension    Review of Systems:  Please see pertinent ROS reviewed in HPI and problem based charting.   Physical Exam:  Vitals:   06/17/17 1600  BP: (!) 139/99  Pulse: 96  Temp: 97.6 F (36.4 C)  TempSrc: Oral  SpO2: 99%  Weight: 230 lb 9.6 oz (104.6 kg)   Physical Exam  Constitutional: He is well-developed, well-nourished, and in no distress.  Cardiovascular: Normal rate and regular rhythm.  Pulmonary/Chest: Effort normal and breath sounds normal.  Skin: Skin is warm and dry.  Left foot at base of 4th metatarsal with corn that is firm and tender. Callus on 2nd toe between 1st and 2nd space.  Psychiatric: Mood and affect normal.     Assessment & Plan:   See Encounters Tab for problem based charting.  Patient discussed with Dr. Josem KaufmannKlima.  Hyperlipidemia He is taking atorvastatin 20mg  daily without current complications.  - Refill provided today.  Gout He is taking allopurinol 300mg  daily.  Last uric acid checked in July 2018 was less than 6.  Plan: - Continue current dose of allopurinol.  Refill given today.  Erectile dysfunction Patient doing well on Viagra 50mg  as needed.  Reports good effect with medication.  Plan: - Refill provided today.  Corn of foot Patient with corn at bottom of left foot near 4th metatarsal.   Plan: - Refer to podiatry as noted in plan for "Left foot pain" - Provided written instruction and care for corns and calluses that he can do at home.   Essential hypertension BP Readings from Last 3 Encounters:  06/17/17 (!) 139/99  01/01/17 (!) 138/98  10/22/16 (!)  152/102   His BP is mildly above goal today but he reports being without his medication at prescribed doses for several days.  No complaints of chest pain, shortness of breath or headahce.  Renal function within the past 12 months was normal.  Plan: - continue current dose of amlodipine-olmesartan combination 5-40mg  daily.  Refill given - RTC 6 months  Left foot pain He reports worsening pain over the last year with his 2nd toe on the left due to chronic foot deformity and had surgery to repair a similar defect on the right about 10 years ago.  Plan: - will refer back to podiatry to address this issue

## 2017-06-17 NOTE — Assessment & Plan Note (Signed)
Patient with corn at bottom of left foot near 4th metatarsal.   Plan: - Refer to podiatry as noted in plan for "Left foot pain" - Provided written instruction and care for corns and calluses that he can do at home.

## 2017-06-17 NOTE — Assessment & Plan Note (Signed)
BP Readings from Last 3 Encounters:  06/17/17 (!) 139/99  01/01/17 (!) 138/98  10/22/16 (!) 152/102   His BP is mildly above goal today but he reports being without his medication at prescribed doses for several days.  No complaints of chest pain, shortness of breath or headahce.  Renal function within the past 12 months was normal.  Plan: - continue current dose of amlodipine-olmesartan combination 5-40mg  daily.  Refill given - RTC 6 months

## 2017-06-17 NOTE — Assessment & Plan Note (Signed)
He reports worsening pain over the last year with his 2nd toe on the left due to chronic foot deformity and had surgery to repair a similar defect on the right about 10 years ago.  Plan: - will refer back to podiatry to address this issue

## 2017-06-17 NOTE — Assessment & Plan Note (Signed)
He is taking atorvastatin 20mg  daily without current complications.  - Refill provided today.

## 2017-06-17 NOTE — Assessment & Plan Note (Signed)
He is taking allopurinol 300mg  daily.  Last uric acid checked in July 2018 was less than 6.  Plan: - Continue current dose of allopurinol.  Refill given today.

## 2017-06-18 ENCOUNTER — Encounter: Payer: Self-pay | Admitting: Internal Medicine

## 2017-06-25 NOTE — Progress Notes (Signed)
Case discussed with Dr. Wallace at the time of the visit.  We reviewed the resident's history and exam and pertinent patient test results.  I agree with the assessment, diagnosis and plan of care documented in the resident's note. 

## 2017-06-29 ENCOUNTER — Ambulatory Visit (INDEPENDENT_AMBULATORY_CARE_PROVIDER_SITE_OTHER): Payer: BLUE CROSS/BLUE SHIELD | Admitting: *Deleted

## 2017-06-29 ENCOUNTER — Telehealth: Payer: Self-pay | Admitting: *Deleted

## 2017-06-29 DIAGNOSIS — Z0000 Encounter for general adult medical examination without abnormal findings: Secondary | ICD-10-CM

## 2017-06-29 NOTE — Telephone Encounter (Signed)
Pt presents for TB skin test and c/o elevated BP at home, appt made for wed for tb skin test reading and [facility] for BP eval.

## 2017-06-30 NOTE — Telephone Encounter (Signed)
Sounds good. Thank you

## 2017-07-01 ENCOUNTER — Ambulatory Visit: Payer: Self-pay

## 2017-07-01 LAB — TB SKIN TEST
INDURATION: 0 mm
TB Skin Test: NEGATIVE

## 2017-07-07 ENCOUNTER — Telehealth: Payer: Self-pay | Admitting: *Deleted

## 2017-07-07 NOTE — Telephone Encounter (Signed)
Opened note by accident

## 2017-08-07 MED FILL — ALLOPURINOL 300 MG TABLET: 300 | 30 days supply | Qty: 30 | Fill #2

## 2017-08-07 MED FILL — AMLODIPINE-OLMESARTAN 5-40: 5-40 | 30 days supply | Qty: 30 | Fill #2

## 2017-08-07 MED FILL — ATORVASTATIN 20 MG TABLET: 20 | 30 days supply | Qty: 30 | Fill #2

## 2017-08-27 ENCOUNTER — Ambulatory Visit: Payer: Self-pay | Admitting: Podiatry

## 2017-09-08 ENCOUNTER — Ambulatory Visit: Payer: BC Managed Care – PPO | Admitting: Podiatry

## 2017-09-09 MED FILL — AMLODIPINE-OLMESARTAN 5-40: 5-40 | 30 days supply | Qty: 30 | Fill #3

## 2017-09-09 MED FILL — ALLOPURINOL 300 MG TABLET: 300 | 30 days supply | Qty: 30 | Fill #3

## 2017-09-09 MED FILL — ATORVASTATIN 20 MG TABLET: 20 | 30 days supply | Qty: 30 | Fill #3

## 2017-09-28 ENCOUNTER — Ambulatory Visit: Payer: BC Managed Care – PPO | Admitting: Podiatry

## 2017-10-10 ENCOUNTER — Encounter: Payer: Self-pay | Admitting: *Deleted

## 2017-10-19 MED FILL — ATORVASTATIN 20 MG TABLET: 20 | 30 days supply | Qty: 30 | Fill #0

## 2017-10-19 MED FILL — AMLODIPINE-OLMESARTAN 5-40: 5-40 | 30 days supply | Qty: 30 | Fill #4

## 2017-10-19 MED FILL — ALLOPURINOL 300 MG TABLET: 300 | 30 days supply | Qty: 30 | Fill #0

## 2017-11-20 MED FILL — ATORVASTATIN CALCIUM 20 MG: 20 | 30 days supply | Qty: 30 | Fill #1

## 2017-11-20 MED FILL — ALLOPURINOL 300 MG TABLET: 300 | 30 days supply | Qty: 30 | Fill #1

## 2017-11-20 MED FILL — AMLODIPINE-OLMESARTAN 5-40: 5-40 | 30 days supply | Qty: 30 | Fill #5

## 2017-11-24 ENCOUNTER — Other Ambulatory Visit: Payer: Self-pay

## 2017-11-24 ENCOUNTER — Encounter: Payer: Self-pay | Admitting: Internal Medicine

## 2017-11-24 ENCOUNTER — Encounter (INDEPENDENT_AMBULATORY_CARE_PROVIDER_SITE_OTHER): Payer: Self-pay

## 2017-11-24 ENCOUNTER — Ambulatory Visit (INDEPENDENT_AMBULATORY_CARE_PROVIDER_SITE_OTHER): Payer: BC Managed Care – PPO | Admitting: Internal Medicine

## 2017-11-24 VITALS — BP 142/89 | HR 73 | Temp 98.0°F | Ht 72.0 in | Wt 229.5 lb

## 2017-11-24 DIAGNOSIS — I10 Essential (primary) hypertension: Secondary | ICD-10-CM | POA: Diagnosis not present

## 2017-11-24 DIAGNOSIS — M109 Gout, unspecified: Secondary | ICD-10-CM | POA: Diagnosis not present

## 2017-11-24 DIAGNOSIS — R7303 Prediabetes: Secondary | ICD-10-CM | POA: Diagnosis not present

## 2017-11-24 DIAGNOSIS — M67911 Unspecified disorder of synovium and tendon, right shoulder: Secondary | ICD-10-CM | POA: Insufficient documentation

## 2017-11-24 NOTE — Progress Notes (Signed)
   CC: Right shoulder pain  HPI:  [name] is a 57 y.o. male with essential hypertension, gout, prediabetes who presents for right shoulder pain.  Past Medical History:  Diagnosis Date  . Gout   . Hyperlipidemia   . Hypertension    Review of Systems:   Has shoulder pain Denies neck stiffness, neck pain, headaches  Physical Exam:  Vitals:   11/24/17 0943  BP: (!) 142/89  Pulse: 73  Temp: 98 F (36.7 C)  TempSrc: Oral  SpO2: 99%  Weight: 229 lb 8 oz (104.1 kg)  Height: 6' (1.829 m)   Physical Exam  Constitutional: He appears well-developed and well-nourished. No distress.  HENT:  Head: Normocephalic and atraumatic.  Eyes: Conjunctivae are normal.  Cardiovascular: Normal rate, regular rhythm and normal heart sounds.  Respiratory: Effort normal and breath sounds normal. No respiratory distress. He has no wheezes.  GI: Soft. Bowel sounds are normal. He exhibits no distension. There is no tenderness.  Musculoskeletal:  Positive neers, hawkins, and empty can test. 5/5 muscle strength in bilateral upper extremities and sensation intact. Tenderness in right shoulder with internal rotation. Patient unable to abduct past 90 degrees.   Neurological: He is alert.  Skin: He is not diaphoretic.  Psychiatric: He has a normal mood and affect. His behavior is normal. Judgment and thought content normal.     Assessment & Plan:   See Encounters Tab for problem based charting.  Right rotator cuff tendinopathy The patient states that he slipped and fell while he was at church a year ago.  The patient states that his pain had initially subsided and it subsequently returned a few months ago.  He describes his pain and spasm and over his anterior shoulder, 8/10 in intensity, sharp in nature and constant.  He has not been seen for his pain and he is only used ibuprofen occasionally.  Patient states that he is been having it hard time raising his right arm above 90 degrees.  He has  been having difficulty sleeping on his right side and he cannot use his right arm for various activities due to limited range of motion.  Assessment and plan The patient had an acute trauma that caused his right shoulder injury.  H is limited range of motion and positive Neer's, Hawkins, empty can test suggest that he likely has a rotator cuff tendinopathy.    Referred the patient for physical therapy and recommended him use ibuprofen 800 mg twice daily as needed.  If the patient's pain continues despite physical therapy then may consider steroid injection to his right shoulder although studies suggest that steroid injection to the shoulder is not significantly more useful than oral nsaid use.   Patient discussed with Dr. Rogelia BogaButcher

## 2017-11-24 NOTE — Patient Instructions (Addendum)
It was a pleasure to see you today Ms. Tyler Burch. Please make the following changes:  -Please use ibuprofen 800mg  twice a day as needed -Please go to physical therapy that we have referred you to   If you have any questions or concerns, please call our clinic at [phone] between 9am-5pm and after hours call [phone] and ask for the internal medicine resident on call. If you feel you are having a medical emergency please call 911.   Thank you, we look forward to help you remain healthy!  Lorenso CourierVahini Zury Fazzino, MD Internal Medicine PGY2

## 2017-11-24 NOTE — Assessment & Plan Note (Signed)
The patient states that he slipped and fell while he was at church a year ago.  The patient states that his pain had initially subsided and it subsequently returned a few months ago.  He describes his pain and spasm and over his anterior shoulder, 8/10 in intensity, sharp in nature and constant.  He has not been seen for his pain and he is only used ibuprofen occasionally.  Patient states that he is been having it hard time raising his right arm above 90 degrees.  He has been having difficulty sleeping on his right side and he cannot use his right arm for various activities due to limited range of motion.  Assessment and plan The patient had an acute trauma that caused his right shoulder injury.  H is limited range of motion and positive Neer's, Hawkins, empty can test suggest that he likely has a rotator cuff tendinopathy.    Referred the patient for physical therapy and recommended him use ibuprofen 800 mg twice daily as needed.  If the patient's pain continues despite physical therapy then may consider steroid injection to his right shoulder although studies suggest that steroid injection to the shoulder is not significantly more useful than oral nsaid use.

## 2017-11-25 NOTE — Progress Notes (Signed)
Internal Medicine Clinic Attending  Case discussed with Dr. Chundi at the time of the visit.  We reviewed the resident's history and exam and pertinent patient test results.  I agree with the assessment, diagnosis, and plan of care documented in the resident's note. 

## 2017-11-27 ENCOUNTER — Encounter: Payer: Self-pay | Admitting: *Deleted

## 2017-12-08 ENCOUNTER — Encounter: Payer: Self-pay | Admitting: Physical Therapy

## 2017-12-08 ENCOUNTER — Ambulatory Visit: Payer: BC Managed Care – PPO | Attending: Hematology | Admitting: Physical Therapy

## 2017-12-08 DIAGNOSIS — R252 Cramp and spasm: Secondary | ICD-10-CM | POA: Insufficient documentation

## 2017-12-08 DIAGNOSIS — M25511 Pain in right shoulder: Secondary | ICD-10-CM

## 2017-12-08 DIAGNOSIS — M25611 Stiffness of right shoulder, not elsewhere classified: Secondary | ICD-10-CM | POA: Diagnosis present

## 2017-12-08 NOTE — Therapy (Signed)
[facility] [address] [place], [place], [address] Phone: [phone]   Fax:  [phone]  Physical Therapy Evaluation  Patient Details  Name: Tyler Burch MRN: [id number] Date of Birth: 1960-12-02 Referring Provider: Iran Ouch   Encounter Date: 12/08/2017  PT End of Session - 12/08/17 1645    Visit Number  1    Date for PT Re-Evaluation  02/07/18    PT Start Time  1600    PT Stop Time  1645    PT Time Calculation (min)  45 min    Activity Tolerance  Patient limited by pain    Behavior During Therapy  [facility] for tasks assessed/performed       Past Medical History:  Diagnosis Date  . Gout   . Hyperlipidemia   . Hypertension     Past Surgical History:  Procedure Laterality Date  . CARDIAC CATHETERIZATION N/A 08/27/2015   Procedure: Left Heart Cath and Coronary Angiography;  Surgeon: Peter M Swaziland, MD;  Location: [facility] INVASIVE CV LAB;  Service: Cardiovascular;  Laterality: N/A;    There were no vitals filed for this visit.   Subjective Assessment - 12/08/17 1609    Subjective  Patient reports that he fell at church about a year ago, he landed on his right elbow, reports that he has had pain since that time and that he has lost ROM and having pain.    Limitations  Lifting;House hold activities    Patient Stated Goals  have better ROM and less pain    Currently in Pain?  Yes    Pain Score  8     Pain Location  Shoulder    Pain Orientation  Right    Pain Descriptors / Indicators  Aching;Sore    Pain Type  Acute pain    Pain Radiating Towards  denies    Pain Onset  More than a month ago    Pain Frequency  Constant    Aggravating Factors   reaching and lifting pain up to 9/10    Pain Relieving Factors  rest, Naproxen pain can be 0/10    Effect of Pain on Daily Activities  difficulty with ADL's         [facility] PT Assessment - 12/08/17 0001      Assessment   Medical Diagnosis  right shoulder tendonopathy    Referring Provider  V. Chundi    Onset Date/Surgical Date  11/07/17    Hand Dominance  Right    Prior Therapy  no      Precautions   Precautions  None      Balance Screen   Has the patient fallen in the past 6 months  No    Has the patient had a decrease in activity level because of a fear of falling?   No    Is the patient reluctant to leave their home because of a fear of falling?   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#, push and pull    Leisure  no exercise      Posture/Postural Control   Posture Comments  fwd head, rounded shoulders      ROM / Strength   AROM / PROM / Strength  AROM;PROM;Strength  AROM   Overall AROM Comments  all motions increase pain     AROM Assessment Site  Shoulder    Right/Left Shoulder  Right    Right Shoulder Flexion  110 Degrees    Right Shoulder ABduction  78 Degrees    Right Shoulder Internal Rotation  30 Degrees    Right Shoulder External Rotation  10 Degrees      PROM   Overall PROM Comments  empty end feel all limited due to pain    PROM Assessment Site  Shoulder    Right/Left Shoulder  Right    Right Shoulder Flexion  115 Degrees    Right Shoulder ABduction  85 Degrees    Right Shoulder Internal Rotation  40 Degrees    Right Shoulder External Rotation  15 Degrees      Strength   Overall Strength Comments  4-/5 with some pain for ER/IR, 3/5 for fleixon and abduction in the range due to pain      Palpation   Palpation comment  he c/o tenderness in the right shoulder anterior and laterally      Special Tests   Other special tests  pain with impingement test, the empty can test was painful but with verbal cues he could hold against pressure                Objective measurements completed on examination: See above findings.      [facility] Adult PT  Treatment/Exercise - 12/08/17 0001      Modalities   Modalities  Iontophoresis      Iontophoresis   Type of Iontophoresis  Dexamethasone    Location  right anterior/lateral shoulder    Dose  80mA    Time  4 hour patch             PT Education - 12/08/17 1645    Education Details  gave HEP for gentle AAROM of the right shoulder    Person(s) Educated  Patient    Methods  Explanation;Demonstration;Tactile cues;Verbal cues;Handout    Comprehension  Verbalized understanding;Need further instruction       PT Short Term Goals - 12/08/17 1719      PT SHORT TERM GOAL #1   Title  independent with HEP    Time  2    Period  Weeks    Status  New        PT Long Term Goals - 12/08/17 1719      PT LONG TERM GOAL #1   Title  understand proper posture and body mechanics    Time  8    Period  Weeks    Status  New      PT LONG TERM GOAL #2   Title  increase shoulder ROM to 140 degrees flexion    Time  8    Period  Weeks    Status  New      PT LONG TERM GOAL #3   Title  increase right shoulder AROM for ER to 60 degrees    Time  8    Period  Weeks    Status  New      PT LONG TERM GOAL #4   Title  decrease pain 50%    Time  8    Period  Weeks    Status  New             Plan - 12/08/17 1646    Clinical Impression Statement  Patient reports that he  fell about a year ago on the right shoulder.  He has had some pain since and reports that he has started to lose ROM and is having difficulty with ADL's.  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  He has very limited ROM and is very painful, he had good ER/IR strength witht eh elbows at side.  Pain with impingment test, gave good resistance with empty can but needed cues.  Poor posture and weak scapula    Clinical Presentation  Stable    Clinical Decision Making  Low    Rehab Potential  Good    PT Frequency  2x / week    PT Duration  8 weeks    PT Treatment/Interventions  ADLs/Self Care Home  Management;Cryotherapy;Electrical Stimulation;Iontophoresis 4mg /ml Dexamethasone;Functional mobility training;Therapeutic activities;Therapeutic exercise;Neuromuscular re-education;Manual techniques;Dry needling;Patient/family education    PT Next Visit Plan  slowly add exercises, modalities for pain    Consulted and Agree with Plan of Care  Patient       Patient will benefit from skilled therapeutic intervention in order to improve the following deficits and impairments:  Decreased range of motion, Increased muscle spasms, Impaired UE functional use, Pain, Impaired flexibility, Improper body mechanics, Postural dysfunction, Decreased strength, Increased edema  Visit Diagnosis: Acute pain of right shoulder - Plan: PT plan of care cert/re-cert  Stiffness of right shoulder, not elsewhere classified - Plan: PT plan of care cert/re-cert  Cramp and spasm - Plan: PT plan of care cert/re-cert     Problem List Patient Active Problem List   Diagnosis Date Noted  . Tendinopathy of right rotator cuff 11/24/2017  . Left foot pain 06/17/2017  . Erectile dysfunction 10/22/2016  . Pre-diabetes 10/22/2016  . Corn of foot 07/15/2016  . Hyperlipidemia 10/22/2015  . Hypertensive cardiovascular disease (LVH) 08/27/2015  . Gout 12/06/2012  . Health care maintenance 12/06/2012  . Essential hypertension 06/09/2008  . Chronic eczema 06/09/2008    Jearld Lesch., PT 12/08/2017, 5:23 PM  [facility] [address] [place], [place], [address] Phone: [phone]   Fax:  [phone]  Name: Tyler Burch MRN: [id number] Date of Birth: [birth year]

## 2017-12-17 ENCOUNTER — Encounter: Payer: Self-pay | Admitting: Internal Medicine

## 2017-12-17 ENCOUNTER — Ambulatory Visit: Payer: BC Managed Care – PPO | Admitting: Physical Therapy

## 2017-12-24 ENCOUNTER — Encounter: Payer: Self-pay | Admitting: Physical Therapy

## 2017-12-24 ENCOUNTER — Ambulatory Visit: Payer: BC Managed Care – PPO | Attending: Hematology | Admitting: Physical Therapy

## 2017-12-24 DIAGNOSIS — M25611 Stiffness of right shoulder, not elsewhere classified: Secondary | ICD-10-CM | POA: Diagnosis present

## 2017-12-24 DIAGNOSIS — M25511 Pain in right shoulder: Secondary | ICD-10-CM | POA: Insufficient documentation

## 2017-12-24 DIAGNOSIS — R600 Localized edema: Secondary | ICD-10-CM | POA: Insufficient documentation

## 2017-12-24 DIAGNOSIS — R252 Cramp and spasm: Secondary | ICD-10-CM | POA: Insufficient documentation

## 2017-12-24 MED FILL — ATORVASTATIN CALCIUM 20 MG: 20 | 30 days supply | Qty: 30 | Fill #2

## 2017-12-24 MED FILL — AMLODIPINE-OLMESARTAN 5-40: 5-40 | 90 days supply | Qty: 90 | Fill #0

## 2017-12-24 NOTE — Therapy (Signed)
[facility] [address] [place], [place], [address] Phone: [phone]   Fax:  [phone]  Physical Therapy Treatment  Patient Details  Name: Tyler Burch MRN: [id number] Date of Birth: May 12, 1960 Referring Provider: [place] OuchV. Chundi   Encounter Date: 12/24/2017  PT End of Session - 12/24/17 1605    Visit Number  2    Date for PT Re-Evaluation  02/07/18    PT Start Time  1530    PT Stop Time  1620    PT Time Calculation (min)  50 min    Activity Tolerance  Patient tolerated treatment well    Behavior During Therapy  [facility] for tasks assessed/performed       Past Medical History:  Diagnosis Date  . Gout   . Hyperlipidemia   . Hypertension     Past Surgical History:  Procedure Laterality Date  . CARDIAC CATHETERIZATION N/A 08/27/2015   Procedure: Left Heart Cath and Coronary Angiography;  Surgeon: Peter M SwazilandJordan, MD;  [facility];  Service: Cardiovascular;  Laterality: N/A;    There were no vitals filed for this visit.  Subjective Assessment - 12/24/17 1539    Subjective  Patient reports that he does not use the right shoulder much due to the pain.    Currently in Pain?  Yes    Pain Score  8     Pain Location  Shoulder    Pain Orientation  Right    Aggravating Factors   use of the arm                       [facility] Adult PT Treatment/Exercise - 12/24/17 0001      Exercises   Exercises  Shoulder      Shoulder Exercises: Supine   Other Supine Exercises  isometric circles with 4.4# ball      Shoulder Exercises: Standing   External Rotation  Both;20 reps;Theraband    Theraband Level (Shoulder External Rotation)  Level 2 (Red)    Other Standing Exercises  AAROM with wand and PT assist      Shoulder Exercises: ROM/Strengthening   UBE (Upper Arm Bike)  level 2 x 4 minutes    Lat Pull  2 plate;20 reps    Cybex Row  2 plate;20 reps      Modalities   Modalities  Electrical  Stimulation;Vasopneumatic      Programme researcher, broadcasting/film/videolectrical Stimulation   Electrical Stimulation Location  right shoulder    Electrical Stimulation Action  IFC    Electrical Stimulation Parameters  sitting    Electrical Stimulation Goals  Pain      Vasopneumatic   Number Minutes Vasopneumatic   15 minutes    Vasopnuematic Location   Shoulder    Vasopneumatic Pressure  Low    Vasopneumatic Temperature   39               PT Short Term Goals - 12/24/17 1606      PT SHORT TERM GOAL #1   Title  independent with HEP    Status  On-going        PT Long Term Goals - 12/08/17 1719      PT LONG TERM GOAL #1   Title  understand proper posture and body mechanics    Time  8    Period  Weeks    Status  New      PT LONG TERM GOAL #2  Title  increase shoulder ROM to 140 degrees flexion    Time  8    Period  Weeks    Status  New      PT LONG TERM GOAL #3   Title  increase right shoulder AROM for ER to 60 degrees    Time  8    Period  Weeks    Status  New      PT LONG TERM GOAL #4   Title  decrease pain 50%    Time  8    Period  Weeks    Status  New            Plan - 12/24/17 1605    Clinical Impression Statement  Patient seemed to tolerate exercises well, still with high rating of pain and compensation especially with AROM of the arm away from the body, tends to elevate shoudler and needs cues to work on this    PT Next Visit Plan  slowly add exercises, modalities for pain    Consulted and Agree with Plan of Care  Patient       Patient will benefit from skilled therapeutic intervention in order to improve the following deficits and impairments:  Decreased range of motion, Increased muscle spasms, Impaired UE functional use, Pain, Impaired flexibility, Improper body mechanics, Postural dysfunction, Decreased strength, Increased edema  Visit Diagnosis: Acute pain of right shoulder  Stiffness of right shoulder, not elsewhere classified  Cramp and spasm  Localized  edema     Problem List Patient Active Problem List   Diagnosis Date Noted  . Tendinopathy of right rotator cuff 11/24/2017  . Left foot pain 06/17/2017  . Erectile dysfunction 10/22/2016  . Pre-diabetes 10/22/2016  . Corn of foot 07/15/2016  . Hyperlipidemia 10/22/2015  . Hypertensive cardiovascular disease (LVH) 08/27/2015  . Gout 12/06/2012  . Health care maintenance 12/06/2012  . Essential hypertension 06/09/2008  . Chronic eczema 06/09/2008    Jearld Lesch., PT 12/24/2017, 4:07 PM  [facility] [address] [place], [place], [address] Phone: [phone]   Fax:  [phone]  Name: Tyler Burch MRN: [id number] Date of Birth: 1960-08-31

## 2017-12-28 MED FILL — ALLOPURINOL 300 MG TABS: 300 | 30 days supply | Qty: 30 | Fill #2

## 2017-12-31 ENCOUNTER — Telehealth: Payer: Self-pay | Admitting: Internal Medicine

## 2018-01-06 ENCOUNTER — Ambulatory Visit: Payer: BC Managed Care – PPO | Admitting: Physical Therapy

## 2018-01-07 ENCOUNTER — Other Ambulatory Visit: Payer: Self-pay

## 2018-01-07 ENCOUNTER — Encounter: Payer: Self-pay | Admitting: Internal Medicine

## 2018-01-07 ENCOUNTER — Ambulatory Visit (INDEPENDENT_AMBULATORY_CARE_PROVIDER_SITE_OTHER): Payer: BC Managed Care – PPO | Admitting: Internal Medicine

## 2018-01-07 VITALS — BP 125/85 | HR 88 | Temp 98.1°F | Ht 72.0 in | Wt 222.0 lb

## 2018-01-07 DIAGNOSIS — E785 Hyperlipidemia, unspecified: Secondary | ICD-10-CM

## 2018-01-07 DIAGNOSIS — Z23 Encounter for immunization: Secondary | ICD-10-CM

## 2018-01-07 DIAGNOSIS — R7303 Prediabetes: Secondary | ICD-10-CM

## 2018-01-07 DIAGNOSIS — Z79899 Other long term (current) drug therapy: Secondary | ICD-10-CM

## 2018-01-07 DIAGNOSIS — M109 Gout, unspecified: Secondary | ICD-10-CM

## 2018-01-07 DIAGNOSIS — M1A00X1 Idiopathic chronic gout, unspecified site, with tophus (tophi): Secondary | ICD-10-CM

## 2018-01-07 DIAGNOSIS — L309 Dermatitis, unspecified: Secondary | ICD-10-CM

## 2018-01-07 DIAGNOSIS — Z0000 Encounter for general adult medical examination without abnormal findings: Secondary | ICD-10-CM

## 2018-01-07 DIAGNOSIS — M79642 Pain in left hand: Secondary | ICD-10-CM | POA: Diagnosis not present

## 2018-01-07 DIAGNOSIS — I10 Essential (primary) hypertension: Secondary | ICD-10-CM

## 2018-01-07 DIAGNOSIS — Z791 Long term (current) use of non-steroidal anti-inflammatories (NSAID): Secondary | ICD-10-CM

## 2018-01-07 DIAGNOSIS — N529 Male erectile dysfunction, unspecified: Secondary | ICD-10-CM

## 2018-01-07 LAB — POCT GLYCOSYLATED HEMOGLOBIN (HGB A1C): HEMOGLOBIN A1C: 5.7 % — AB (ref 4.0–5.6)

## 2018-01-07 LAB — GLUCOSE, CAPILLARY: Glucose-Capillary: 103 mg/dL — ABNORMAL HIGH (ref 70–99)

## 2018-01-07 MED ORDER — TRIAMCINOLONE ACETONIDE 0.025 % EX OINT: 1.0000 "application " | TOPICAL_OINTMENT | Freq: Two times a day (BID) | CUTANEOUS | 3 refills | Status: DC

## 2018-01-07 MED FILL — TRIAMCINOLONE 0.025% OINT: 0.025 | 30 days supply | Qty: 80 | Fill #0

## 2018-01-07 NOTE — Assessment & Plan Note (Addendum)
Flu shot given today.   Has not had a colonoscopy in the past. Will place GI referral today.

## 2018-01-07 NOTE — Assessment & Plan Note (Signed)
Well controlled with amlodipine-olmesartan. Denies chest pain, sob, or headache. Continue medication regiment and check renal function today.

## 2018-01-07 NOTE — Assessment & Plan Note (Signed)
H/o eczema on bilateral elbows and wrist. Triamcinolone ointment provides relief. Does not use lotion. He has eucerin at home and I encouraged him to use lotion 1-2 times a day and only use triamcinolone ointment prn. Exam with dry patches of skin on elbows and wrists.   Plan: - refill triamcinolone ointment  - encouraged daily use of lotion

## 2018-01-07 NOTE — Patient Instructions (Addendum)
It was nice seeing you today. Thank you for choosing [facility] for your Primary Care.   Glad things are going well! It was very nice to meet you. Someone will call you about scheduling your colonoscopy. Please continue taking your medicines. I will call you with any abnormal lab results.   Continue taking Naproxen for your hand pain. If the pain worsens, come back to the clinic.    FOLLOW-UP INSTRUCTIONS When: as needed for hand pain   Please contact the clinic if you have any problems, or need to be seen sooner.

## 2018-01-07 NOTE — Progress Notes (Signed)
   CC: follow up of HTN, pre-diabetes. New left hand pain    HPI:  [name] is a 57 y.o. male with HTN, erectile dysfunction, gout, pre-diabetes, HLD who presents for follow up of chronic diseases and is also endorsing new left hand pain.   Past Medical History:  Diagnosis Date  . Gout   . Hyperlipidemia   . Hypertension     Physical Exam:  Vitals:   01/07/18 1606  BP: 125/85  Pulse: 88  Temp: 98.1 F (36.7 C)  TempSrc: Oral  SpO2: 100%  Weight: 222 lb (100.7 kg)  Height: 6' (1.829 m)   Gen: Well appearing, NAD CV: RRR, no murmurs Ext: Left hand without nodules, erythema, swelling, or heat. Tenderness over MCP joints. Decreased ROM with fist movement. Normal upper extremity reflexes.  Skin:  Dry patches on bilateral elbows   Assessment & Plan:   See Encounters Tab for problem based charting.  Patient seen with Dr. Cyndie Chime

## 2018-01-07 NOTE — Assessment & Plan Note (Signed)
Well controlled with allopurinol. Endorsing right hand pain, has h/o gout flare in the right hand but current pain does not feel similar to prior gout attacks. Will recheck uric acid today.

## 2018-01-07 NOTE — Assessment & Plan Note (Addendum)
Acute. He is endorsing left hand pain that started two weeks ago. Denies trauma. Pain is constant. Worse in the morning and worse with movement and worse in the morning. Nothing like this has happened before. Took Naproxen for one day and it helped. Does not feel like a gout flare. Tenderness over MCP joints with some extension up the fingers. No nodules, redness, swelling, or warmth on exam. Slightly decrease ROM with fist motion. Upper extremity reflexes are normal. Differential includes osteoarthritis, rheumatoid arthritis, gout, tendonopathy.   Plan:  - continue Naproxen for one week. I'm hopeful the pain will improve.  - check uric acid today - f/u if pain does not improve

## 2018-01-07 NOTE — Assessment & Plan Note (Signed)
Previous a1c 5.7. Denies polydipsia or polyuria. Will recheck a1c today.

## 2018-01-08 LAB — BMP8+ANION GAP
Anion Gap: 15 mmol/L (ref 10.0–18.0)
BUN/Creatinine Ratio: 21 — ABNORMAL HIGH (ref 9–20)
BUN: 27 mg/dL — ABNORMAL HIGH (ref 6–24)
CO2: 23 mmol/L (ref 20–29)
Calcium: 9.2 mg/dL (ref 8.7–10.2)
Chloride: 104 mmol/L (ref 96–106)
Creatinine, Ser: 1.29 mg/dL — ABNORMAL HIGH (ref 0.76–1.27)
GFR, EST AFRICAN AMERICAN: 72 mL/min/{1.73_m2} (ref >59)
GFR, EST NON AFRICAN AMERICAN: 62 mL/min/{1.73_m2} (ref >59)
Glucose: 106 mg/dL — ABNORMAL HIGH (ref 65–99)
Potassium: 4.5 mmol/L (ref 3.5–5.2)
SODIUM: 142 mmol/L (ref 134–144)

## 2018-01-08 LAB — URIC ACID: Uric Acid: 4.4 mg/dL (ref 3.7–8.6)

## 2018-01-08 NOTE — Progress Notes (Signed)
Medicine attending: I personally interviewed and briefly examined this patient on the day of the patient visit and reviewed pertinent clinical ,laboratory data  with resident physician Dr. Maryelizabeth Kaufmann and we discussed a management plan. Hx HTN & gout. 2 wk hx pain, soft tissues L hand at level of PIP joints, no indication that this is related to gout. No abnormal physical findings except soft tissue tenderness and inability to make a tight fist w left hand due to discomfort. Vascular exam normal. Getting relief w some Naprosyn that he tried. Advised to continue. Call if progressive sxs.

## 2018-02-17 MED FILL — ATORVASTATIN CALCIUM 20 MG: 20 | 30 days supply | Qty: 30 | Fill #3

## 2018-02-17 MED FILL — ALLOPURINOL 300 MG TABS: 300 | 30 days supply | Qty: 30 | Fill #3

## 2018-02-23 ENCOUNTER — Ambulatory Visit: Payer: BC Managed Care – PPO | Attending: Hematology | Admitting: Physical Therapy

## 2018-04-02 MED FILL — ATORVASTATIN CALCIUM 20 MG: 20 | 30 days supply | Qty: 30 | Fill #4

## 2018-04-02 MED FILL — ALLOPURINOL 300 MG TABS: 300 | 30 days supply | Qty: 30 | Fill #4

## 2018-04-02 MED FILL — AMLODIPINE-OLMESARTAN 5-40: 5-40 | 90 days supply | Qty: 90 | Fill #1

## 2018-05-13 ENCOUNTER — Encounter: Payer: Self-pay | Admitting: Internal Medicine

## 2018-05-13 ENCOUNTER — Ambulatory Visit: Payer: Self-pay

## 2018-05-21 ENCOUNTER — Other Ambulatory Visit: Payer: Self-pay | Admitting: Internal Medicine

## 2018-05-21 MED FILL — ATORVASTATIN CALCIUM 20 MG: 20 | 30 days supply | Qty: 30 | Fill #5

## 2018-05-25 ENCOUNTER — Other Ambulatory Visit: Payer: Self-pay | Admitting: Internal Medicine

## 2018-05-25 DIAGNOSIS — I10 Essential (primary) hypertension: Secondary | ICD-10-CM

## 2018-05-25 MED ORDER — AMLODIPINE-OLMESARTAN 5-40 MG PO TABS: 1.0000 | ORAL_TABLET | Freq: Every day | ORAL | 1 refills | Status: DC

## 2018-05-25 MED FILL — ALLOPURINOL 300 MG TABS: 300 | 30 days supply | Qty: 30 | Fill #5

## 2018-06-02 ENCOUNTER — Encounter: Payer: Self-pay | Admitting: Internal Medicine

## 2018-07-07 ENCOUNTER — Encounter: Payer: Self-pay | Admitting: *Deleted

## 2018-07-08 ENCOUNTER — Encounter: Payer: Self-pay | Admitting: Internal Medicine

## 2018-07-09 ENCOUNTER — Other Ambulatory Visit: Payer: Self-pay | Admitting: Internal Medicine

## 2018-07-09 DIAGNOSIS — M1000 Idiopathic gout, unspecified site: Secondary | ICD-10-CM

## 2018-07-12 MED FILL — ALLOPURINOL 300 MG TABS: 300 | 90 days supply | Qty: 90 | Fill #0

## 2018-08-05 ENCOUNTER — Ambulatory Visit (INDEPENDENT_AMBULATORY_CARE_PROVIDER_SITE_OTHER): Payer: BC Managed Care – PPO | Admitting: Internal Medicine

## 2018-08-05 ENCOUNTER — Other Ambulatory Visit: Payer: Self-pay

## 2018-08-05 DIAGNOSIS — I10 Essential (primary) hypertension: Secondary | ICD-10-CM

## 2018-08-05 NOTE — Progress Notes (Signed)
  [facility] Residency Telephone Encounter Continuity Care Appointment  HPI:   This telephone encounter was created for Mr. Jesie Wallock on 08/05/2018 for the following purpose/cc hypertension, dizziness.   Past Medical History:  Past Medical History:  Diagnosis Date  . Gout   . Hyperlipidemia   . Hypertension       ROS:   +dizziness  - syncope, n/v/d, chest pain   Assessment / Plan / Recommendations:   Please see A&P under problem oriented charting for assessment of the patient's acute and chronic medical conditions.   As always, pt is advised that if symptoms worsen or new symptoms arise, they should go to an urgent care facility or to to ER for further evaluation.   Consent and Medical Decision Making:   Patient discussed with Dr. Heide Spark  This is a telephone encounter between [name] and Ali Lowe on 08/05/2018 for hypertension, dizziness. The visit was conducted with the patient located at home and Ali Lowe at [facility]. The patient's identity was confirmed using their DOB and current address. The patient has consented to being evaluated through a telephone encounter and understands the associated risks (an examination cannot be done and the patient may need to come in for an appointment) / benefits (allows the patient to remain at home, decreasing exposure to coronavirus). I personally spent 9 minutes on medical discussion.

## 2018-08-05 NOTE — Assessment & Plan Note (Signed)
Does not check is BP at home. Compliant with amlodipine-olmesartan 5-40mg  daily without missing doses. He does endorse 2 weeks of dizziness on standing, lasting a few seconds with associated blurry vision. No palpitations, chest pain, or sob. Denies syncope. Good po intake. Drinks water throughout the day. Denies vomiting or diarrhea. I am concerned about orthostatic hypotension. He has a home BP cuff and I have instructed him to check his BP while sitting and standing and call the clinic with his results.   - continue the above medication - check orthostatics, if positive, will decrease olmesartan to 20mg  - symptoms sound very mild, I do not think he needs an in-person visit at this time.

## 2018-08-06 NOTE — Progress Notes (Signed)
Internal Medicine Clinic Attending  Case discussed with Dr. Vogel at the time of the visit.  We reviewed the resident's history and pertinent patient test results.  I agree with the assessment, diagnosis, and plan of care documented in the resident's note.   

## 2018-08-11 MED FILL — AMLODIPINE-OLMESARTAN 5-40: 5-40 | 30 days supply | Qty: 30 | Fill #0 | Status: TO

## 2018-09-02 ENCOUNTER — Encounter: Payer: BC Managed Care – PPO | Admitting: Internal Medicine

## 2018-09-09 ENCOUNTER — Encounter: Payer: Self-pay | Admitting: Internal Medicine

## 2018-10-02 ENCOUNTER — Encounter: Payer: Self-pay | Admitting: *Deleted

## 2018-12-09 ENCOUNTER — Other Ambulatory Visit: Payer: Self-pay

## 2018-12-09 ENCOUNTER — Ambulatory Visit (INDEPENDENT_AMBULATORY_CARE_PROVIDER_SITE_OTHER): Payer: BC Managed Care – PPO | Admitting: Internal Medicine

## 2018-12-09 ENCOUNTER — Encounter: Payer: Self-pay | Admitting: Internal Medicine

## 2018-12-09 VITALS — BP 148/110 | HR 92 | Temp 98.2°F | Ht 72.0 in | Wt 229.4 lb

## 2018-12-09 DIAGNOSIS — Z79899 Other long term (current) drug therapy: Secondary | ICD-10-CM | POA: Diagnosis not present

## 2018-12-09 DIAGNOSIS — Z0000 Encounter for general adult medical examination without abnormal findings: Secondary | ICD-10-CM

## 2018-12-09 DIAGNOSIS — N529 Male erectile dysfunction, unspecified: Secondary | ICD-10-CM

## 2018-12-09 DIAGNOSIS — I10 Essential (primary) hypertension: Secondary | ICD-10-CM | POA: Diagnosis not present

## 2018-12-09 DIAGNOSIS — R7303 Prediabetes: Secondary | ICD-10-CM

## 2018-12-09 MED ORDER — OLMESARTAN MEDOXOMIL-HCTZ 20-12.5 MG PO TABS: 1.0000 | ORAL_TABLET | Freq: Every day | ORAL | 0 refills | Status: DC

## 2018-12-09 MED FILL — OLMESARTAN-HCTZ 20-12.5 MG: 20-12.5 | 30 days supply | Qty: 30 | Fill #0

## 2018-12-09 NOTE — Patient Instructions (Signed)
Thank you, Tyler Burch for allowing [place] to provide your care today. Today we discussed blood pressure and erectile dysfunction.    I have ordered none today labs for you. I will call if any are abnormal.    I have place a referrals to gastroenterology for colonoscopy.   I have ordered the following tests: None today  I have ordered the following medication/changed the following medications: Stop taking amlodipine-olmesartan.  Start taking olmesartan- hydrochlorothiazide.  Please follow-up in 1 month.    Should you have any questions or concerns please call the internal medicine clinic at [phone].    Marianna Payment, D.O. [facility]

## 2018-12-13 ENCOUNTER — Other Ambulatory Visit: Payer: Self-pay | Admitting: Internal Medicine

## 2018-12-13 MED FILL — SILDENAFIL CITRATE 50 MG TA: 50 | 25 days supply | Qty: 5 | Fill #0

## 2018-12-13 NOTE — Telephone Encounter (Signed)
Refill Request    sildenafil (VIAGRA) 50 MG tablet(Expired)    triamcinolone (KENALOG) 0.025 % ointment    [place] [place] - [address].

## 2018-12-13 NOTE — Assessment & Plan Note (Signed)
Patient continues to have elevated blood pressure of 148/110.  He is currently taking amlodipine-olmesartan.  He states that he occasionally gets dizzy when standing from a seated position.  He denies loss of consciousness, blacking out, sense of room spinning, diaphoresis, palpitations, or headache.  Orthostatic evaluation today was negative.  I counseled him regarding the likelihood that his dizziness is due to a medication side effect.  He stated that he would like to take a conservative approach and try an alternative blood pressure medication prior to further evaluation.  Amlodipine could be the cause of his dizziness and it is reasonable to try a different blood pressure medication at this time.     Plan: - I will switch the patient to olmesartan-hydrochlorothiazide. - I like him to return in 1 month in order to titrate the medication for optimal blood pressure control.

## 2018-12-13 NOTE — Progress Notes (Signed)
   CC: Hypertension  HPI:  [name] is a 58 y.o. male with a past medical history stated below and presents today for hypertension. Please see problem based assessment and plan for additional details.    Past Medical History:  Diagnosis Date  . Gout   . Hyperlipidemia   . Hypertension      Review of Systems: Review of Systems  Constitutional: Negative for fever, malaise/fatigue and weight loss.  Respiratory: Negative for shortness of breath.   Cardiovascular: Negative for chest pain, palpitations, orthopnea and leg swelling.  Genitourinary: Negative for dysuria, flank pain, frequency and hematuria.  Neurological: Positive for dizziness. Negative for tingling, sensory change, speech change, focal weakness, weakness and headaches.  Psychiatric/Behavioral: Negative for depression.     Vitals:   12/09/18 0940 12/09/18 0941 12/09/18 0943 12/09/18 0944  BP: (!) 150/97 (!) 134/94 (!) 134/99 (!) 148/110  Pulse: 88 90 89 92  Temp: 98.2 F (36.8 C)     TempSrc: Oral     SpO2: 100%     Weight: 229 lb 6.4 oz (104.1 kg)     Height: 6' (1.829 m)        Physical Exam: Physical Exam  Constitutional: He is oriented to person, place, and time and well-developed, well-nourished, and in no distress.  Cardiovascular: Normal rate, regular rhythm, normal heart sounds and intact distal pulses. Exam reveals no gallop and no friction rub.  No murmur heard. Pulmonary/Chest: Effort normal and breath sounds normal.  Musculoskeletal: Normal range of motion.  Neurological: He is alert and oriented to person, place, and time.  Skin: Skin is warm and dry.     Assessment & Plan:   See Encounters Tab for problem based charting.  Patient seen with Dr. Dareen Piano

## 2018-12-13 NOTE — Progress Notes (Signed)
Internal Medicine Clinic Attending  I saw and evaluated the patient.  I personally confirmed the key portions of the history and exam documented by Dr. Coe and I reviewed pertinent patient test results.  The assessment, diagnosis, and plan were formulated together and I agree with the documentation in the resident's note.    

## 2018-12-13 NOTE — Assessment & Plan Note (Signed)
Patient states that he continues to have problems with erectile dysfunction.  Denies a.m. erection.  Currently taking sildenafil with minimal success.  He states that 1 out of 3 times taking the medication is successful.  He states that he has a difficult time maintaining an erection.  I counseled him regarding urology referral at this time.  He states that he drinks several beers each night.  He would like to try to decrease his alcohol intake prior to urology referral to see if that will help his erectile dysfunction.  Plan: - Continue sildenafil. - Follow-up in 1 month.  If patient continues to be symptomatic, refer to urology.

## 2018-12-17 NOTE — Telephone Encounter (Signed)
Received refill request for Viagra. Last filled on 8/31 for 5 tablets. Refill request denied. Patient should have one refill left on his last prescription.   Per last PCP note, patient has had only partial response to 50 mg of Viagra. Referral to urology was discussed. Could consider increasing to 100 mg given incomplete response.

## 2019-01-24 ENCOUNTER — Other Ambulatory Visit: Payer: Self-pay | Admitting: Internal Medicine

## 2019-01-24 DIAGNOSIS — I10 Essential (primary) hypertension: Secondary | ICD-10-CM

## 2019-01-26 MED FILL — SUPREP BOWEL PREP KIT: 17.5-3.13-1 | 1 days supply | Qty: 354 | Fill #0

## 2019-01-27 ENCOUNTER — Encounter: Payer: Self-pay | Admitting: Internal Medicine

## 2019-01-27 ENCOUNTER — Other Ambulatory Visit: Payer: Self-pay

## 2019-01-27 ENCOUNTER — Ambulatory Visit (INDEPENDENT_AMBULATORY_CARE_PROVIDER_SITE_OTHER): Payer: BC Managed Care – PPO | Admitting: Internal Medicine

## 2019-01-27 VITALS — BP 164/106 | HR 83 | Temp 98.3°F | Wt 230.7 lb

## 2019-01-27 DIAGNOSIS — M19042 Primary osteoarthritis, left hand: Secondary | ICD-10-CM

## 2019-01-27 DIAGNOSIS — I10 Essential (primary) hypertension: Secondary | ICD-10-CM | POA: Diagnosis not present

## 2019-01-27 DIAGNOSIS — E785 Hyperlipidemia, unspecified: Secondary | ICD-10-CM | POA: Diagnosis not present

## 2019-01-27 DIAGNOSIS — N529 Male erectile dysfunction, unspecified: Secondary | ICD-10-CM

## 2019-01-27 DIAGNOSIS — Z79899 Other long term (current) drug therapy: Secondary | ICD-10-CM

## 2019-01-27 DIAGNOSIS — I739 Peripheral vascular disease, unspecified: Secondary | ICD-10-CM | POA: Diagnosis not present

## 2019-01-27 LAB — POCT GLYCOSYLATED HEMOGLOBIN (HGB A1C): Hemoglobin A1C: 5.4 % (ref 4.0–5.6)

## 2019-01-27 LAB — GLUCOSE, CAPILLARY: Glucose-Capillary: 98 mg/dL (ref 70–99)

## 2019-01-27 MED ORDER — DICLOFENAC SODIUM 1 % TD GEL: 4.0000 g | Freq: Two times a day (BID) | TRANSDERMAL | 4 refills | Status: DC

## 2019-01-27 MED ORDER — OLMESARTAN MEDOXOMIL-HCTZ 20-12.5 MG PO TABS: 1.0000 | ORAL_TABLET | Freq: Every day | ORAL | 0 refills | Status: DC

## 2019-01-27 MED ORDER — DICLOFENAC SODIUM 1 % TD GEL: 4.0000 g | Freq: Two times a day (BID) | TRANSDERMAL | Status: DC

## 2019-01-27 MED FILL — OLMESARTAN-HCTZ 20-12.5 MG: 20-12.5 | 30 days supply | Qty: 30 | Fill #0

## 2019-01-27 NOTE — Progress Notes (Signed)
   CC: Erectile Dysfunction   HPI:  [name] is a 58 y.o. male with a past medical history stated below and presents today for further evaluation and management of his hypertension and erectile dysfunction . Please see problem based assessment and plan for additional details.  Past Medical History:  Diagnosis Date  . Gout   . Hyperlipidemia   . Hypertension     Review of Systems: Review of Systems  Constitutional: Negative for chills, diaphoresis, fever, malaise/fatigue and weight loss.  Respiratory: Negative for cough, sputum production, shortness of breath and wheezing.   Cardiovascular: Negative for chest pain, palpitations and leg swelling.  Gastrointestinal: Negative for abdominal pain, blood in stool, constipation and nausea.  Musculoskeletal: Positive for joint pain. Negative for myalgias and neck pain.  Neurological: Negative for dizziness and weakness.  Psychiatric/Behavioral: Negative for depression.     Vitals:   01/27/19 1423 01/27/19 1510  BP: (!) 160/101 (!) 164/106  Pulse: 91 83  Temp: 98.3 F (36.8 C)   TempSrc: Oral   SpO2: 98%   Weight: 230 lb 11.2 oz (104.6 kg)      Physical Exam: Physical Exam  Constitutional: He is oriented to person, place, and time and well-developed, well-nourished, and in no distress.  HENT:  Head: Atraumatic.  Eyes: EOM are normal.  Neck: Normal range of motion.  Cardiovascular: Normal rate, regular rhythm, normal heart sounds and intact distal pulses. Exam reveals no gallop and no friction rub.  No murmur heard. Pulmonary/Chest: Effort normal and breath sounds normal. No respiratory distress. He exhibits no tenderness.  Abdominal: Soft. There is no abdominal tenderness.  Musculoskeletal: Normal range of motion.     Comments: Patient has joint pain at the 3rd MCP joint. The joint is swollen without surrounding erythema or warmth.    Neurological: He is oriented to person, place, and time.  Skin: Skin is warm and dry.      Assessment & Plan:   See Encounters Tab for problem based charting.  Patient seen with Dr. Evette Doffing

## 2019-01-27 NOTE — Patient Instructions (Signed)
Thank you, [name] for allowing [place] to provide your care today. Today we discussed Urology referral, hand arthritis, blood pressure.    I have ordered Basic metabolic panel, and hemoglobin A1c, lipid panel labs for you. I will call if any are abnormal.    I have place a referrals to Urology for ED treatment options.   I have ordered the following tests: Colonoscopy    I have ordered the following medication/changed the following medications: Benicar (olmesartan-HCTZ)  Please follow-up in 1 month.    Should you have any questions or concerns please call the internal medicine clinic at [phone].    Marianna Payment, D.O. [facility]

## 2019-01-28 LAB — BMP8+ANION GAP
Anion Gap: 15 mmol/L (ref 10.0–18.0)
BUN/Creatinine Ratio: 22 — ABNORMAL HIGH (ref 9–20)
BUN: 28 mg/dL — ABNORMAL HIGH (ref 6–24)
CO2: 21 mmol/L (ref 20–29)
Calcium: 9 mg/dL (ref 8.7–10.2)
Chloride: 103 mmol/L (ref 96–106)
Creatinine, Ser: 1.29 mg/dL — ABNORMAL HIGH (ref 0.76–1.27)
GFR calc Af Amer: 71 mL/min/{1.73_m2} (ref >59)
GFR calc non Af Amer: 62 mL/min/{1.73_m2} (ref >59)
Glucose: 90 mg/dL (ref 65–99)
Potassium: 4.7 mmol/L (ref 3.5–5.2)
Sodium: 139 mmol/L (ref 134–144)

## 2019-01-28 LAB — LIPID PANEL
Chol/HDL Ratio: 3.5 ratio (ref 0.0–5.0)
Cholesterol, Total: 208 mg/dL — ABNORMAL HIGH (ref 100–199)
HDL: 60 mg/dL (ref >39)
LDL Chol Calc (NIH): 99 mg/dL (ref 0–99)
Triglycerides: 295 mg/dL — ABNORMAL HIGH (ref 0–149)
VLDL Cholesterol Cal: 49 mg/dL — ABNORMAL HIGH (ref 5–40)

## 2019-01-31 ENCOUNTER — Telehealth: Payer: Self-pay | Admitting: *Deleted

## 2019-01-31 ENCOUNTER — Other Ambulatory Visit: Payer: Self-pay | Admitting: *Deleted

## 2019-01-31 DIAGNOSIS — M1000 Idiopathic gout, unspecified site: Secondary | ICD-10-CM

## 2019-02-01 DIAGNOSIS — M1990 Unspecified osteoarthritis, unspecified site: Secondary | ICD-10-CM | POA: Insufficient documentation

## 2019-02-01 NOTE — Assessment & Plan Note (Addendum)
Patient returns for follow-up for erectile dysfunction.  During the last visit the patient states that he wanted to pursue conservative management with decreasing alcohol intake prior to pursuing further evaluation with urology.  On today's evaluation the patient states that his erectile dysfunction has not improved.  He states that his problem has caused significant emotional distress.  He does not receive any resolution of his symptoms with sildenafil at this time.  I discussed with the patient that his erectile dysfunction is likely secondary to his peripheral artery disease and suggested a referral to urology at this time. The patient was agreeable to this plan. - Referred patient to Urology - Continue Sildenifil.

## 2019-02-01 NOTE — Assessment & Plan Note (Addendum)
Patient continues to have elevated blood pressure. Most recent blood pressure was 140s/80s. He will  likely need better control over his blood pressure. Patient was stopped on amlodipine due to dizziness and states that his dizziness has resolved since stopping the medication.  - Continue olmesartan/hydrochlorothiazide.

## 2019-02-01 NOTE — Assessment & Plan Note (Signed)
Patient admits to increased pain in his left third MCP joint.  He states this has been going on for a long time but has recently progressed in the last 2 weeks.  His pain is likely secondary to osteoarthritis.  We will get an x-ray to rule out underlying fractures.  I will prescribe the patient topical Voltaren gel for pain relief at this time. - XR of left hand - Diclofenac topical gel for left joint pain.

## 2019-02-01 NOTE — Telephone Encounter (Signed)
Information sent through CoverMyMeds for [place] for Dicofenac Gel 1%. On 01/31/2019.  Approved on 01/31/2019 through 01/30/2022 if patient continues with his present insurance.  Sander Nephew, RN 02/01/19 2:04 PM

## 2019-02-01 NOTE — Assessment & Plan Note (Addendum)
Continue high intensity statin.  Patient is tolerating medication well.

## 2019-02-02 MED ORDER — ALLOPURINOL 300 MG PO TABS: 300.0000 mg | ORAL_TABLET | Freq: Every day | ORAL | 1 refills | Status: DC

## 2019-02-02 NOTE — Progress Notes (Signed)
Internal Medicine Clinic Attending  I saw and evaluated the patient.  I personally confirmed the key portions of the history and exam documented by Dr. Coe and I reviewed pertinent patient test results.  The assessment, diagnosis, and plan were formulated together and I agree with the documentation in the resident's note.    

## 2019-02-02 NOTE — Addendum Note (Signed)
Addended by: Lalla Brothers T on: 02/02/2019 08:13 AM   Modules accepted: Level of Service

## 2019-02-08 ENCOUNTER — Other Ambulatory Visit: Payer: Self-pay | Admitting: *Deleted

## 2019-02-08 DIAGNOSIS — M19042 Primary osteoarthritis, left hand: Secondary | ICD-10-CM

## 2019-02-08 MED ORDER — DICLOFENAC SODIUM 1 % TD GEL: 4.0000 g | Freq: Two times a day (BID) | TRANSDERMAL | 4 refills | Status: DC

## 2019-02-08 MED FILL — DICLOFENAC SODIUM 1 % GEL: 1 | 50 days supply | Qty: 400 | Fill #0

## 2019-02-17 ENCOUNTER — Other Ambulatory Visit: Payer: Self-pay | Admitting: Internal Medicine

## 2019-02-21 MED FILL — TRIAMCINOLONE 0.1% OINTMEN: 0.1 | 30 days supply | Qty: 30 | Fill #0

## 2019-02-28 ENCOUNTER — Other Ambulatory Visit: Payer: Self-pay | Admitting: Internal Medicine

## 2019-02-28 DIAGNOSIS — I10 Essential (primary) hypertension: Secondary | ICD-10-CM

## 2019-03-03 ENCOUNTER — Encounter: Payer: Self-pay | Admitting: Internal Medicine

## 2019-03-03 ENCOUNTER — Ambulatory Visit (INDEPENDENT_AMBULATORY_CARE_PROVIDER_SITE_OTHER): Payer: BC Managed Care – PPO | Admitting: Internal Medicine

## 2019-03-03 ENCOUNTER — Other Ambulatory Visit: Payer: Self-pay

## 2019-03-03 VITALS — BP 150/110 | HR 81 | Temp 98.4°F | Wt 234.2 lb

## 2019-03-03 DIAGNOSIS — E785 Hyperlipidemia, unspecified: Secondary | ICD-10-CM | POA: Diagnosis not present

## 2019-03-03 DIAGNOSIS — I10 Essential (primary) hypertension: Secondary | ICD-10-CM | POA: Diagnosis not present

## 2019-03-03 DIAGNOSIS — N529 Male erectile dysfunction, unspecified: Secondary | ICD-10-CM | POA: Diagnosis not present

## 2019-03-03 DIAGNOSIS — M19042 Primary osteoarthritis, left hand: Secondary | ICD-10-CM | POA: Diagnosis not present

## 2019-03-03 DIAGNOSIS — Z79899 Other long term (current) drug therapy: Secondary | ICD-10-CM

## 2019-03-03 MED ORDER — HYDROCHLOROTHIAZIDE 12.5 MG PO CAPS: 12.5000 mg | ORAL_CAPSULE | Freq: Every day | ORAL | 11 refills | Status: DC

## 2019-03-03 MED ORDER — ATORVASTATIN CALCIUM 20 MG PO TABS: ORAL_TABLET | ORAL | 1 refills | Status: DC

## 2019-03-03 MED ORDER — OLMESARTAN MEDOXOMIL 40 MG PO TABS: 20.0000 mg | ORAL_TABLET | Freq: Every day | ORAL | 5 refills | Status: DC

## 2019-03-03 MED FILL — OLMESARTAN MEDOXOMIL 40 MG: 40 | 30 days supply | Qty: 15 | Fill #0

## 2019-03-03 MED FILL — ATORVASTATIN 20 MG TABLET: 20 | 90 days supply | Qty: 90 | Fill #0

## 2019-03-03 MED FILL — HYDROCHLOROTHIAZIDE 12.5 MG: 12.5 | 30 days supply | Qty: 30 | Fill #0

## 2019-03-03 NOTE — Progress Notes (Signed)
   CC: Hypertension  HPI:  [name] is a 58 y.o. male with a past medical history stated below and presents today for further evaluation and management of his hypertension. Please see problem based assessment and plan for additional details.    Past Medical History:  Diagnosis Date  . Gout   . Hyperlipidemia   . Hypertension       Review of Systems: Review of Systems  Constitutional: Negative for chills, fever and malaise/fatigue.  Respiratory: Negative for cough and shortness of breath.   Cardiovascular: Negative for chest pain and leg swelling.  Gastrointestinal: Negative for abdominal pain, blood in stool and constipation.  Genitourinary: Negative for dysuria and hematuria.  Musculoskeletal: Positive for joint pain.  Psychiatric/Behavioral: Negative for depression.     There were no vitals filed for this visit.   Physical Exam: Physical Exam  Constitutional: He is oriented to person, place, and time and well-developed, well-nourished, and in no distress. No distress.  HENT:  Head: Normocephalic and atraumatic.  Eyes: EOM are normal.  Neck: Normal range of motion. Neck supple.  Cardiovascular: Normal rate, regular rhythm, normal heart sounds and intact distal pulses. Exam reveals no gallop and no friction rub.  No murmur heard. Pulmonary/Chest: Effort normal and breath sounds normal. No respiratory distress. He exhibits no tenderness.  Abdominal: Soft. He exhibits no distension. There is no abdominal tenderness.  Musculoskeletal: Normal range of motion.        General: No tenderness or edema.  Neurological: He is alert and oriented to person, place, and time.  Skin: Skin is warm and dry.     Assessment & Plan:   See Encounters Tab for problem based charting.  Patient seen with Dr. Dareen Piano

## 2019-03-03 NOTE — Assessment & Plan Note (Addendum)
Patient's blood pressure medicine was increased at his last visit.  He is currently on Benicar 20-12.5 mg daily. He presents today for reevaluation.  Patient states that he believes the medication may be causing him headaches in the mornings.  States that when he is not taking the medication he does not get headaches.  But then states he is taking the medication every day.  Difficult to determine whether or not the patient is compliant with his medications at this time.  I have separate medications so that they will be more affordable.  Told the patient to call us if he cannot afford the new prescriptions or if the medications are continuing to cause headaches.  We will reevaluate in 1 month.  Pressure was still elevated today.

## 2019-03-03 NOTE — Assessment & Plan Note (Signed)
Patient is tolerating atorvastatin 20 mg well.

## 2019-03-03 NOTE — Patient Instructions (Addendum)
Thank you, [name] for allowing [place] to provide your care today. Today we discussed HTN.    I have ordered none labs for you. I will call if any are abnormal.    I have place a referrals to none.   I have ordered the following tests: none   I have ordered the following medication/changed the following medications: Switched to Olmesartan and HCTZ two separate  Pills. Please continue taking atorvastatin.   Please follow-up in 6 months.    Should you have any questions or concerns please call the internal medicine clinic at [phone].    Marianna Payment, D.O. [facility]

## 2019-03-03 NOTE — Assessment & Plan Note (Addendum)
At her last visit the patient was referred to urology for further evaluation and management.  The patient is still using Viagra 50 mg for erectile dysfunction.  Patient did not follow-up with urologist because he states that his erectile dysfunction has mildly improved.  He believes is possibly due to psychological stress.  I told him to let us know if he wants [place] to put in the referral again.

## 2019-03-03 NOTE — Assessment & Plan Note (Signed)
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  Ordered x-rays.  Patient still has not done the x-rays.  Patient was prescribed diclofenac gel for his hand pain. -Continue diclofenac -Instructed patient to get the x-rays today.

## 2019-03-04 NOTE — Progress Notes (Signed)
Internal Medicine Clinic Attending  I saw and evaluated the patient.  I personally confirmed the key portions of the history and exam documented by Dr. Coe and I reviewed pertinent patient test results.  The assessment, diagnosis, and plan were formulated together and I agree with the documentation in the resident's note.    

## 2019-03-18 NOTE — Addendum Note (Signed)
Addended by: Shun Pletz N on: 03/18/2019 02:36 PM   Modules accepted: Level of Service  

## 2019-03-31 ENCOUNTER — Encounter: Payer: Self-pay | Admitting: Internal Medicine

## 2019-03-31 ENCOUNTER — Ambulatory Visit (INDEPENDENT_AMBULATORY_CARE_PROVIDER_SITE_OTHER): Payer: BC Managed Care – PPO | Admitting: Internal Medicine

## 2019-03-31 ENCOUNTER — Other Ambulatory Visit: Payer: Self-pay

## 2019-03-31 VITALS — BP 142/99 | HR 94 | Temp 97.5°F | Ht 72.0 in | Wt 220.7 lb

## 2019-03-31 DIAGNOSIS — I10 Essential (primary) hypertension: Secondary | ICD-10-CM

## 2019-03-31 DIAGNOSIS — M109 Gout, unspecified: Secondary | ICD-10-CM | POA: Diagnosis not present

## 2019-03-31 DIAGNOSIS — Z79899 Other long term (current) drug therapy: Secondary | ICD-10-CM

## 2019-03-31 MED FILL — SILDENAFIL CITRATE 50 MG TA: 50 | 25 days supply | Qty: 5 | Fill #0

## 2019-03-31 NOTE — Patient Instructions (Signed)
FOLLOW-UP INSTRUCTIONS When: 3-4 months For: Blood pressure and routine visit What to bring: All of your medications  I have not made any changes to medication regimen today.  Please continue to take all your medicines have been prescribed.  If you notice any signs of gout please notify us.  At that time might consider discontinuing the HCTZ and considering an alternative medication for your blood pressure.  Thank you for your visit to the [facility] today. If you have any questions or concerns please call us at [phone].

## 2019-03-31 NOTE — Progress Notes (Signed)
Internal Medicine Clinic Attending  Case discussed with Dr. Harbrecht at the time of the visit.  We reviewed the resident's history and exam and pertinent patient test results.  I agree with the assessment, diagnosis, and plan of care documented in the resident's note.   

## 2019-03-31 NOTE — Progress Notes (Signed)
   CC: HTn  HPI:[name] is a 58 y.o. male who presents for evaluation of HTN. Please see individual problem based A/P for details.  Past Medical History:  Diagnosis Date  . Gout   . Hyperlipidemia   . Hypertension    Review of Systems:  ROS negative except as per HPI.  Physical Exam: Vitals:   03/31/19 1449  BP: (!) 142/99  Pulse: 94  Temp: (!) 97.5 F (36.4 C)  TempSrc: Oral  SpO2: 100%  Weight: 220 lb 11.2 oz (100.1 kg)  Height: 6' (1.829 m)   General: A/O x4, in no acute distress, afebrile, nondiaphoretic HEENT: PEERL, EMO intact Cardio: RRR, no mrg's  Pulmonary: CTA bilaterally, no wheezing or crackles  MSK: BLE nontender, nonedematous Psych: Appropriate affect, not depressed in appearance, engages well  Assessment & Plan:   See Encounters Tab for problem based charting.  Patient discussed with Dr. Evette Burch

## 2019-03-31 NOTE — Assessment & Plan Note (Signed)
  Hypertension: Patient's BP today is 142/99 with a goal of <140/80. The patient endorses adherence to his medication regimen. He denied, chest pain, headache, visual changes, lightheadedness, weakness, dizziness on standing, swelling in the feet or ankles.  He has remained compliant with hydrochlorothiazide and olmesartan since his last visit.  He denies any symptoms and feels very well overall.  He would like to remain on his current regimen.  He denies a recent gout flare in the past 9 months. Given the large degree of medication changes over the past several visits I feel that an additional change given his stability could be avoided. Technically thiazide diuretics are contraindicated in patients with gout but his appears to be well controlled.   Plan: Continue HCTZ 12.5 mg daily Continue olmesartan 20 mg daily BMP today Will need to discontinue his thiazide if he has recurrent gout flares Return in 3 to 4 months with your PCP for follow-up

## 2019-04-01 LAB — BMP8+ANION GAP
Anion Gap: 22 mmol/L — ABNORMAL HIGH (ref 10.0–18.0)
BUN/Creatinine Ratio: 18 (ref 9–20)
BUN: 26 mg/dL — ABNORMAL HIGH (ref 6–24)
CO2: 16 mmol/L — ABNORMAL LOW (ref 20–29)
Calcium: 9.7 mg/dL (ref 8.7–10.2)
Chloride: 105 mmol/L (ref 96–106)
Creatinine, Ser: 1.44 mg/dL — ABNORMAL HIGH (ref 0.76–1.27)
GFR calc Af Amer: 62 mL/min/{1.73_m2} (ref >59)
GFR calc non Af Amer: 54 mL/min/{1.73_m2} — ABNORMAL LOW (ref >59)
Glucose: 109 mg/dL — ABNORMAL HIGH (ref 65–99)
Potassium: 4.5 mmol/L (ref 3.5–5.2)
Sodium: 143 mmol/L (ref 134–144)

## 2019-04-13 ENCOUNTER — Telehealth: Payer: Self-pay | Admitting: Internal Medicine

## 2019-04-13 NOTE — Telephone Encounter (Signed)
No answer

## 2019-04-22 MED FILL — OLMESARTAN MEDOXOMIL 40 MG: 40 | 30 days supply | Qty: 15 | Fill #1

## 2019-04-22 MED FILL — HYDROCHLOROTHIAZIDE 12.5 MG: 12.5 | 30 days supply | Qty: 30 | Fill #1

## 2019-05-03 MED FILL — HYDROCHLOROTHIAZIDE 12.5 MG: 12.5 | 30 days supply | Qty: 30 | Fill #1

## 2019-05-03 MED FILL — OLMESARTAN MEDOXOMIL 40 MG: 40 | 30 days supply | Qty: 15 | Fill #1

## 2019-06-01 MED FILL — HYDROCHLOROTHIAZIDE 12.5 MG: 12.5 | 30 days supply | Qty: 30 | Fill #2

## 2019-06-01 MED FILL — OLMESARTAN MEDOXOMIL 40 MG: 40 | 30 days supply | Qty: 15 | Fill #2

## 2019-06-11 ENCOUNTER — Ambulatory Visit: Payer: BC Managed Care – PPO | Attending: Internal Medicine

## 2019-06-11 DIAGNOSIS — Z23 Encounter for immunization: Secondary | ICD-10-CM | POA: Insufficient documentation

## 2019-06-11 NOTE — Progress Notes (Signed)
   Covid-19 Vaccination Clinic  Name:  Melinda Pottinger    MRN: [id number] DOB: 1960/05/24  06/11/2019  Mr. Fulfer was observed post Covid-19 immunization for 15 minutes without incidence. He was provided with Vaccine Information Sheet and instruction to access the V-Safe system.   Mr. Blagg was instructed to call 911 with any severe reactions post vaccine: Marland Kitchen Difficulty breathing  . Swelling of your face and throat  . A fast heartbeat  . A bad rash all over your body  . Dizziness and weakness    Immunizations Administered    Name Date Dose VIS Date Route   Pfizer COVID-19 Vaccine 06/11/2019  6:28 PM 0.3 mL 03/25/2019 Intramuscular   Manufacturer: [employer], [employer]   Lot: [id number]   NDC: [address]-5

## 2019-07-02 ENCOUNTER — Ambulatory Visit: Payer: BC Managed Care – PPO | Attending: Internal Medicine

## 2019-07-02 DIAGNOSIS — Z23 Encounter for immunization: Secondary | ICD-10-CM

## 2019-07-02 NOTE — Progress Notes (Signed)
   Covid-19 Vaccination Clinic  Name:  Xue Low    MRN: [id number] DOB: 02-28-1961  07/02/2019  Mr. Kendzierski was observed post Covid-19 immunization for 15 minutes without incident. He was provided with Vaccine Information Sheet and instruction to access the V-Safe system.   Mr. Podgorski was instructed to call 911 with any severe reactions post vaccine: Marland Kitchen Difficulty breathing  . Swelling of face and throat  . A fast heartbeat  . A bad rash all over body  . Dizziness and weakness   Immunizations Administered    Name Date Dose VIS Date Route   Pfizer COVID-19 Vaccine 07/02/2019 10:26 AM 0.3 mL 03/25/2019 Intramuscular   Manufacturer: [employer], [employer]   Lot: [id number]   NDC: [address]-8

## 2019-07-04 MED FILL — HYDROCHLOROTHIAZIDE 12.5 MG: 12.5 | 30 days supply | Qty: 30 | Fill #3

## 2019-07-04 MED FILL — OLMESARTAN MEDOXOMIL 40 MG: 40 | 30 days supply | Qty: 15 | Fill #3

## 2019-07-07 ENCOUNTER — Other Ambulatory Visit: Payer: Self-pay

## 2019-07-07 ENCOUNTER — Ambulatory Visit (INDEPENDENT_AMBULATORY_CARE_PROVIDER_SITE_OTHER): Payer: BC Managed Care – PPO | Admitting: Internal Medicine

## 2019-07-07 DIAGNOSIS — K130 Diseases of lips: Secondary | ICD-10-CM | POA: Diagnosis not present

## 2019-07-07 DIAGNOSIS — M1000 Idiopathic gout, unspecified site: Secondary | ICD-10-CM

## 2019-07-07 DIAGNOSIS — E785 Hyperlipidemia, unspecified: Secondary | ICD-10-CM

## 2019-07-07 DIAGNOSIS — I10 Essential (primary) hypertension: Secondary | ICD-10-CM

## 2019-07-07 DIAGNOSIS — J438 Other emphysema: Secondary | ICD-10-CM

## 2019-07-07 MED ORDER — HYDROCHLOROTHIAZIDE 12.5 MG PO CAPS: 12.5000 mg | ORAL_CAPSULE | Freq: Every day | ORAL | 11 refills | Status: DC

## 2019-07-07 MED ORDER — ATORVASTATIN CALCIUM 20 MG PO TABS: ORAL_TABLET | ORAL | 1 refills | Status: DC

## 2019-07-07 MED ORDER — ALLOPURINOL 300 MG PO TABS: 300.0000 mg | ORAL_TABLET | Freq: Every day | ORAL | 1 refills | Status: DC

## 2019-07-07 MED ORDER — SILDENAFIL CITRATE 50 MG PO TABS: 50.0000 mg | ORAL_TABLET | ORAL | 1 refills | Status: DC | PRN

## 2019-07-07 MED ORDER — OLMESARTAN MEDOXOMIL 40 MG PO TABS: 20.0000 mg | ORAL_TABLET | Freq: Every day | ORAL | 5 refills | Status: DC

## 2019-07-07 MED FILL — ALLOPURINOL 300 MG TABS: 300 | 90 days supply | Qty: 90 | Fill #0

## 2019-07-07 MED FILL — ATORVASTATIN 20 MG TABLET: 20 | 90 days supply | Qty: 90 | Fill #0

## 2019-07-07 NOTE — Progress Notes (Signed)
I connected with  Tyler Burch on 07/07/19 by a video enabled telemedicine application and verified that I am speaking with the correct person using two identifiers.   I discussed the limitations of evaluation and management by telemedicine. The patient expressed understanding and agreed to proceed.     CC: HTN  This is a telephone encounter between Tyler Burch and Tyler Burch on 07/07/2019 for HTN follow up and medication refill. The visit was conducted with the patient located at home and Tyler Burch at [facility]. The patient's identity was confirmed using their DOB and current address. The patient has consented to being evaluated through a telephone encounter and understands the associated risks (an examination cannot be done and the patient may need to come in for an appointment) / benefits (allows the patient to remain at home, decreasing exposure to coronavirus). I personally spent 10 minutes on medical discussion.   HPI:  Tyler Burch is a 59 y.o. with PMH as below. Patient presented today for a follow up for his HTN. He states that he is taking HCTZ 12.5 mg and Olmesartan 40 mg daily without negative side effects. He was previously experiencing some dizziness when taking his antihypertensive medications He states that has resolved since stopping amlodipine. He periodically measures his blood pressure at home, but is not sure exactly what his last pressure reading was. I counseled him on taking his blood pressure periodically  to make sure it is not high and to take his medications as prescribed. He states that he needs refills for his medications today.  In regards to his erectile dysfunction, he states that the sildenafil is working well and he is pleased with his progress. During one of our last appointments I referred him to Urology for further evaluation and management of this issue. He states that he has an appointment coming up ina week or two.   Please see A&P for assessment of the patient's  acute and chronic medical conditions.   Past Medical History:  Diagnosis Date  . Gout   . Hyperlipidemia   . Hypertension    Review of Systems:  All review of systems are negative except what is noted on the HPI.     Assessment & Plan:   See Encounters Tab for problem based charting.  Patient discussed with Dr. Criselda Peaches

## 2019-07-18 ENCOUNTER — Other Ambulatory Visit: Payer: Self-pay | Admitting: Internal Medicine

## 2019-07-19 MED FILL — TRIAMCINOLONE 0.1% OINTMEN: 0.1 | 15 days supply | Qty: 30 | Fill #0

## 2019-07-20 ENCOUNTER — Encounter: Payer: Self-pay | Admitting: Internal Medicine

## 2019-07-20 ENCOUNTER — Other Ambulatory Visit: Payer: Self-pay

## 2019-07-20 ENCOUNTER — Ambulatory Visit (INDEPENDENT_AMBULATORY_CARE_PROVIDER_SITE_OTHER): Payer: BC Managed Care – PPO | Admitting: Internal Medicine

## 2019-07-20 DIAGNOSIS — Z79899 Other long term (current) drug therapy: Secondary | ICD-10-CM | POA: Diagnosis not present

## 2019-07-20 DIAGNOSIS — K130 Diseases of lips: Secondary | ICD-10-CM

## 2019-07-20 DIAGNOSIS — I10 Essential (primary) hypertension: Secondary | ICD-10-CM | POA: Diagnosis not present

## 2019-07-20 MED ORDER — WHITE PETROLATUM EX OINT: 1.0000 "application " | TOPICAL_OINTMENT | CUTANEOUS | 0 refills | Status: AC | PRN

## 2019-07-20 MED FILL — OLMESARTAN MEDOXOMIL 40 MG: 40 | 30 days supply | Qty: 15 | Fill #0

## 2019-07-20 MED FILL — HYDROCHLOROTHIAZIDE 12.5 MG: 12.5 | 30 days supply | Qty: 30 | Fill #0

## 2019-07-20 NOTE — Patient Instructions (Addendum)
To [name],  It was a pleasure working with you today. Today we discussed your chapped lips. We will attempt a course of Vaseline for barrier protection of your lips. Please avoid acidic foods and drinks (oranges, lemons, wine), spicy foods, and hot sauces. We will also wait on chaning your blood pressure medications until you pick up and begin to use your new medications. Please continue to take your medications daily and as indicated on the label. Please follow up with your primary care physician. Have a good day.  Sincerely,  Dolan Amen, MD

## 2019-07-20 NOTE — Assessment & Plan Note (Signed)
Patient presenting with chapped lips over the last 2 weeks. Patient denies new medications, antibiotics, face washes, shaving cream, or acidic fruits. Endorses pain when opening his mouth and when eating acidic meals. Has tried aquaphor and blistex without relief.   Assessment:  Patient presenting with chapped lips with unknown trigger. Physical exam findings are reassuring that his symptoms are not due to Tyler Burch's syndrome, or viral sores. Will attempt vaseline for barrier protection. Patient instructed to avoid situations that cause dehydration. Instructed to place barrier protection at night, especially since patient sleeps with bedside fan. Plan:  - Vaseline ordered, reassess at next visit.

## 2019-07-20 NOTE — Assessment & Plan Note (Addendum)
Patient presents with BP of 162/111 and 161/106  on repeat presenting to the clinic. Patient recently discussed his medications with his PCP on 07/07/2019 for a telehealth visit. He was prescribed HCTZ 12.5 mg QD, and omesartan 40 mg QD. Patient has not picked up his refills, and has been out of medications for the past 3 days. Will reassess BP on follow up and make adjustments accordingly.  Plan:  - Patient instructed to pick up current medical regimen.  - Education provided on taking medications daily and as prescribed.

## 2019-07-20 NOTE — Progress Notes (Signed)
   CC: Chapped Lips  HPI:  [name] is a 59 y.o. with a PMH noted below presents to the clinic for chapped lips. To see the acute and chronic management of his conditions, see the A&P note.   Past Medical History:  Diagnosis Date  . Gout   . Hyperlipidemia   . Hypertension    Review of Systems:   Review of Systems  Constitutional: Negative for fever, malaise/fatigue and weight loss.  HENT: Negative for congestion, sinus pain and sore throat.   Eyes: Negative for blurred vision and double vision.  Respiratory: Negative for cough, sputum production, shortness of breath and wheezing.   Cardiovascular: Negative for chest pain, palpitations and orthopnea.  Gastrointestinal: Negative for abdominal pain, constipation, diarrhea, nausea and vomiting.  Musculoskeletal: Negative for myalgias.  Skin: Negative for itching and rash.       Chapped lips  Neurological: Negative for dizziness and headaches.    Physical Exam:  Vitals:   07/20/19 1530 07/20/19 1540  BP: (!) 162/111 (!) 161/106  Pulse: 82 82  Temp: 98.4 F (36.9 C)   TempSrc: Oral   SpO2: 100%   Weight: 218 lb 3.2 oz (99 kg)   Height: 6' (1.829 m)    Physical Exam Constitutional:      Appearance: Normal appearance.  HENT:     Head: Normocephalic and atraumatic.     Nose: Nose normal. No congestion or rhinorrhea.     Mouth/Throat:     Mouth: Mucous membranes are dry.     Pharynx: Posterior oropharyngeal erythema present.     Comments: Lips show peeling and erythema. Does not affect the vermilion border or mucosal surfaces. No blistering or open lesions present.  Eyes:     General: No scleral icterus.       Right eye: No discharge.        Left eye: No discharge.     Conjunctiva/sclera: Conjunctivae normal.  Cardiovascular:     Rate and Rhythm: Normal rate and regular rhythm.     Pulses: Normal pulses.     Heart sounds: Normal heart sounds. No murmur. No friction rub. No gallop.   Pulmonary:     Effort:  Pulmonary effort is normal.     Breath sounds: Normal breath sounds. No wheezing, rhonchi or rales.  Abdominal:     General: Abdomen is flat. Bowel sounds are normal.     Palpations: Abdomen is soft.     Tenderness: There is no abdominal tenderness. There is no guarding.  Skin:    General: Skin is warm and dry.     Findings: No bruising, erythema, lesion or rash.  Neurological:     Mental Status: He is alert.     Assessment & Plan:   See Encounters Tab for problem based charting.  Patient discussed with Dr. Criselda Peaches

## 2019-07-28 NOTE — Progress Notes (Signed)
Internal Medicine Clinic Attending  Case discussed with Dr. Winters at the time of the visit.  We reviewed the resident's history and exam and pertinent patient test results.  I agree with the assessment, diagnosis, and plan of care documented in the resident's note.  

## 2019-07-28 NOTE — Progress Notes (Signed)
Internal Medicine Clinic Attending  Case discussed with Dr. Coe  at the time of the visit.  We reviewed the resident's history and pertinent patient test results.  I agree with the assessment, diagnosis, and plan of care documented in the resident's note. 

## 2019-07-29 MED FILL — HYDROCHLOROTHIAZIDE 12.5 MG: 12.5 | 30 days supply | Qty: 30 | Fill #0

## 2019-07-29 MED FILL — OLMESARTAN MEDOXOMIL 40 MG: 40 | 30 days supply | Qty: 15 | Fill #0

## 2019-09-28 MED FILL — HYDROCHLOROTHIAZIDE 12.5 MG: 12.5 | 30 days supply | Qty: 30 | Fill #2

## 2019-09-28 MED FILL — OLMESARTAN MEDOXOMIL 40 MG: 40 | 30 days supply | Qty: 15 | Fill #2

## 2019-11-23 MED FILL — HYDROCHLOROTHIAZIDE 12.5 MG: 12.5 | 30 days supply | Qty: 30 | Fill #3

## 2019-12-06 MED FILL — OLMESARTAN MEDOXOMIL 40 MG: 40 | 60 days supply | Qty: 30 | Fill #3

## 2020-01-02 MED FILL — HYDROCHLOROTHIAZIDE 12.5 MG: 12.5 | 30 days supply | Qty: 30 | Fill #4

## 2020-01-05 ENCOUNTER — Ambulatory Visit: Payer: BC Managed Care – PPO | Admitting: Urology

## 2020-01-17 ENCOUNTER — Other Ambulatory Visit: Payer: Self-pay | Admitting: Internal Medicine

## 2020-01-17 DIAGNOSIS — I10 Essential (primary) hypertension: Secondary | ICD-10-CM

## 2020-01-17 MED ORDER — OLMESARTAN MEDOXOMIL 20 MG PO TABS: 20.0000 mg | ORAL_TABLET | Freq: Every day | ORAL | 2 refills | Status: DC

## 2020-01-17 MED FILL — OLMESARTAN MEDOXOMIL 20 MG: 20 | 30 days supply | Qty: 30 | Fill #0

## 2020-01-17 MED FILL — SILDENAFIL CITRATE 100 MG T: 100 | 6 days supply | Qty: 6 | Fill #0

## 2020-01-17 NOTE — Progress Notes (Unsigned)
Patient's Olmesartan 20 mg daily refill with the correct dosage as requested.

## 2020-01-24 ENCOUNTER — Ambulatory Visit (INDEPENDENT_AMBULATORY_CARE_PROVIDER_SITE_OTHER): Payer: BC Managed Care – PPO | Admitting: Internal Medicine

## 2020-01-24 ENCOUNTER — Encounter: Payer: Self-pay | Admitting: Internal Medicine

## 2020-01-24 ENCOUNTER — Other Ambulatory Visit: Payer: Self-pay

## 2020-01-24 VITALS — BP 164/108 | HR 90 | Temp 98.1°F | Ht 72.0 in | Wt 207.0 lb

## 2020-01-24 DIAGNOSIS — I10 Essential (primary) hypertension: Secondary | ICD-10-CM

## 2020-01-24 DIAGNOSIS — N182 Chronic kidney disease, stage 2 (mild): Secondary | ICD-10-CM | POA: Diagnosis not present

## 2020-01-24 DIAGNOSIS — M25512 Pain in left shoulder: Secondary | ICD-10-CM

## 2020-01-24 DIAGNOSIS — E785 Hyperlipidemia, unspecified: Secondary | ICD-10-CM

## 2020-01-24 LAB — POCT GLYCOSYLATED HEMOGLOBIN (HGB A1C): Hemoglobin A1C: 5.5 % (ref 4.0–5.6)

## 2020-01-24 LAB — GLUCOSE, CAPILLARY: Glucose-Capillary: 112 mg/dL — ABNORMAL HIGH (ref 70–99)

## 2020-01-24 NOTE — Patient Instructions (Signed)
Thank you, Mr.Rease Darley for allowing [place] to provide your care today. Today we discussed Blood pressure and left shoulder pain.    I have ordered the following labs for you:   Lab Orders     BMP8+Anion Gap     POC Hbg A1C   I will call if any are abnormal. All of your labs can be accessed through "My Chart".  I have place a referrals to Physical therapy..  I have ordered the following tests: none   I have ordered the following medication/changed the following medications:  1. I will refill your blood pressure medicaiton.  2. Use over the counter Ibuprofen and tylenol for pain   Please follow-up 2 weeks for a blood pressure check. .    Should you have any questions or concerns please call the internal medicine clinic at [phone].    Dellia Cloud, D.O. [facility]

## 2020-01-24 NOTE — Progress Notes (Signed)
CC: HTN  HPI:  Tyler Burch is a 59 y.o. male with a past medical history stated below and presents today for HTN. Please see problem based assessment and plan for additional details.  Past Medical History:  Diagnosis Date  . Gout   . Hyperlipidemia   . Hypertension     Current Outpatient Medications on File Prior to Visit  Medication Sig Dispense Refill  . allopurinol (ZYLOPRIM) 300 MG tablet Take 1 tablet (300 mg total) by mouth daily. 90 tablet 1  . aspirin 81 MG chewable tablet Chew 1 tablet (81 mg total) by mouth daily. 30 tablet 11  . diclofenac sodium (VOLTAREN) 1 % GEL Apply 4 g topically 2 (two) times daily. 400 g 4  . sildenafil (VIAGRA) 50 MG tablet Take 1 tablet (50 mg total) by mouth as needed for erectile dysfunction. 5 tablet 1  . triamcinolone (KENALOG) 0.025 % ointment Apply 1 application topically 2 (two) times daily. 80 g 3  . triamcinolone ointment (KENALOG) 0.1 % APPLY 1 APPLICATION TO THE AFFECTED AREA(S) TWICE A DAY AS DIRECTED 30 g 0  . white petrolatum (VASELINE) OINT Apply 1 application topically as needed for lip care. 1000 g 0  . [DISCONTINUED] triamcinolone ointment (KENALOG) 0.1 % Apply 1 application topically 2 (two) times daily. 30 g 0   No current facility-administered medications on file prior to visit.    Family History  Problem Relation Age of Onset  . Colon cancer Sister     Social History   Socioeconomic History  . Marital status: Married    Spouse name: Not on file  . Number of children: Not on file  . Years of education: Not on file  . Highest education level: Not on file  Occupational History  . Occupation: Forklift  Tobacco Use  . Smoking status: Never Smoker  . Smokeless tobacco: Never Used  Substance and Sexual Activity  . Alcohol use: Yes    Alcohol/week: 0.0 standard drinks    Comment: Sometimes.  . Drug use: No  . Sexual activity: Not on file  Other Topics Concern  . Not on file  Social History Narrative  . Not  on file   Social Determinants of Health   Financial Resource Strain:   . Difficulty of Paying Living Expenses: Not on file  Food Insecurity:   . Worried About Programme researcher, broadcasting/film/video in the Last Year: Not on file  . Ran Out of Food in the Last Year: Not on file  Transportation Needs:   . Lack of Transportation (Medical): Not on file  . Lack of Transportation (Non-Medical): Not on file  Physical Activity:   . Days of Exercise per Week: Not on file  . Minutes of Exercise per Session: Not on file  Stress:   . Feeling of Stress : Not on file  Social Connections:   . Frequency of Communication with Friends and Family: Not on file  . Frequency of Social Gatherings with Friends and Family: Not on file  . Attends Religious Services: Not on file  . Active Member of Clubs or Organizations: Not on file  . Attends Banker Meetings: Not on file  . Marital Status: Not on file  Intimate Partner Violence:   . Fear of Current or Ex-Partner: Not on file  . Emotionally Abused: Not on file  . Physically Abused: Not on file  . Sexually Abused: Not on file    Review of Systems: ROS negative except for what  is noted on the assessment and plan.  Vitals:   01/24/20 1419  BP: (!) 164/108  Pulse: 90  Temp: 98.1 F (36.7 C)  TempSrc: Oral  SpO2: 100%  Weight: 207 lb (93.9 kg)  Height: 6' (1.829 m)     Physical Exam: Physical Exam   Assessment & Plan:   See Encounters Tab for problem based charting.  Patient discussed with Dr. Sarina Ill, D.O. [facility], PGY-2 Pager: [phone], Phone: [phone] Date 01/25/2020 Time 9:14 AM

## 2020-01-25 ENCOUNTER — Other Ambulatory Visit: Payer: Self-pay | Admitting: Internal Medicine

## 2020-01-25 ENCOUNTER — Telehealth: Payer: Self-pay | Admitting: Internal Medicine

## 2020-01-25 ENCOUNTER — Encounter: Payer: Self-pay | Admitting: Internal Medicine

## 2020-01-25 DIAGNOSIS — N182 Chronic kidney disease, stage 2 (mild): Secondary | ICD-10-CM | POA: Insufficient documentation

## 2020-01-25 DIAGNOSIS — M25512 Pain in left shoulder: Secondary | ICD-10-CM | POA: Insufficient documentation

## 2020-01-25 LAB — BMP8+ANION GAP
Anion Gap: 13 mmol/L (ref 10.0–18.0)
BUN/Creatinine Ratio: 21 — ABNORMAL HIGH (ref 9–20)
BUN: 27 mg/dL — ABNORMAL HIGH (ref 6–24)
CO2: 22 mmol/L (ref 20–29)
Calcium: 9.3 mg/dL (ref 8.7–10.2)
Chloride: 103 mmol/L (ref 96–106)
Creatinine, Ser: 1.31 mg/dL — ABNORMAL HIGH (ref 0.76–1.27)
GFR calc Af Amer: 69 mL/min/{1.73_m2} (ref >59)
GFR calc non Af Amer: 60 mL/min/{1.73_m2} (ref >59)
Glucose: 98 mg/dL (ref 65–99)
Potassium: 4.5 mmol/L (ref 3.5–5.2)
Sodium: 138 mmol/L (ref 134–144)

## 2020-01-25 MED ORDER — ATORVASTATIN CALCIUM 20 MG PO TABS: ORAL_TABLET | ORAL | 1 refills | Status: DC

## 2020-01-25 MED ORDER — OLMESARTAN MEDOXOMIL 20 MG PO TABS: 20.0000 mg | ORAL_TABLET | Freq: Every day | ORAL | 2 refills | Status: DC

## 2020-01-25 MED ORDER — HYDROCHLOROTHIAZIDE 12.5 MG PO CAPS: 12.5000 mg | ORAL_CAPSULE | Freq: Every day | ORAL | 11 refills | Status: DC

## 2020-01-25 MED FILL — ATORVASTATIN 20 MG TABLET: 20 | 90 days supply | Qty: 90 | Fill #0

## 2020-01-25 MED FILL — HYDROCHLOROTHIAZIDE 12.5 MG: 12.5 | 30 days supply | Qty: 30 | Fill #0

## 2020-01-25 NOTE — Assessment & Plan Note (Addendum)
Patient presents for reevaluation of his blood pressure.  Patient blood pressure is consistently uncontrolled.  His pressure today is 164/108.  He is currently taking hydrochlorothiazide 12.5 mg and olmesartan 20 mg daily.  He states that he has been out of these medications for the last couple weeks as he had a difficult time with changes at the pharmacy.  I counseled the patient on the importance of staying on these medications consistently.  I will refill his medications today and set him up for 2-week follow-up to recheck his blood pressure.  If his blood pressure is still elevated he will likely need additional medication management to get his blood pressure under control.  Plan: -Refill hydrochlorothiazide 12.5 mg and losartan 20 mg daily - Repeat BMP today to assess kidney function and electrolytes  -Follow-up in 2 weeks.

## 2020-01-25 NOTE — Assessment & Plan Note (Signed)
Patient presents with a 4-week history of left shoulder pain.  Patient states that the pain has been constant throughout this last 4 weeks.  He believes that the pain started after lifting something heavy.  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    On exam patient has full passive and active range of motion, 5 out of 5 strength, and no change in sensation.  Pulses are 2+.  Patient does have mildly positive Hawkins and empty can test concerning for supraspinatus tendon impingement/injury.  I counseled the patient regarding activity modifications and pain management with Tylenol.  I will put in referral for physical therapy for him at this time.  Plan: - Conservative management with activity modification and Tylenol for pain -Put a referral for physical therapy.

## 2020-01-25 NOTE — Assessment & Plan Note (Addendum)
Most recent BMP from this visit shows a creatinine of 1.31 and a GFR of 69.  Patient will likely need to avoid nephrotoxic agents and get better control of his blood pressure. I discussed these results with these results with the patient.

## 2020-01-25 NOTE — Assessment & Plan Note (Signed)
Patient has a history of hyperlipidemia and is taking atorvastatin 20 mg for primary prevention.  He states he is tolerating his medication well.  I will refill it for him today.  Plan: -Continue atorvastatin 20 mg daily

## 2020-01-27 NOTE — Progress Notes (Signed)
Internal Medicine Clinic Attending  Case discussed with Dr. Coe  At the time of the visit.  We reviewed the resident's history and exam and pertinent patient test results.  I agree with the assessment, diagnosis, and plan of care documented in the resident's note.  

## 2020-01-31 ENCOUNTER — Encounter: Payer: Self-pay | Admitting: *Deleted

## 2020-02-10 ENCOUNTER — Ambulatory Visit (INDEPENDENT_AMBULATORY_CARE_PROVIDER_SITE_OTHER): Payer: BC Managed Care – PPO | Admitting: Internal Medicine

## 2020-02-10 ENCOUNTER — Encounter: Payer: Self-pay | Admitting: Internal Medicine

## 2020-02-10 ENCOUNTER — Other Ambulatory Visit: Payer: Self-pay | Admitting: Internal Medicine

## 2020-02-10 ENCOUNTER — Other Ambulatory Visit: Payer: Self-pay

## 2020-02-10 VITALS — BP 135/104 | HR 96 | Temp 98.0°F | Ht 72.0 in | Wt 207.6 lb

## 2020-02-10 DIAGNOSIS — F1010 Alcohol abuse, uncomplicated: Secondary | ICD-10-CM

## 2020-02-10 DIAGNOSIS — I10 Essential (primary) hypertension: Secondary | ICD-10-CM | POA: Diagnosis not present

## 2020-02-10 MED ORDER — HYDROCHLOROTHIAZIDE 25 MG PO TABS: 25.0000 mg | ORAL_TABLET | Freq: Every day | ORAL | 2 refills | Status: DC

## 2020-02-10 MED ORDER — NALTREXONE HCL 50 MG PO TABS: 50.0000 mg | ORAL_TABLET | Freq: Every day | ORAL | 2 refills | Status: DC

## 2020-02-10 MED FILL — HYDROCHLOROTHIAZIDE 25 MG T: 25 | 30 days supply | Qty: 30 | Fill #0

## 2020-02-10 MED FILL — NALTREXONE 50 MG TABLET: 50 | 30 days supply | Qty: 30 | Fill #0

## 2020-02-10 NOTE — Patient Instructions (Signed)
Thank you, Mr.Barnet Fitzsimmons for allowing [place] to provide your care today. Today we discussed Blood pressure and alcohol.    I have ordered the following labs for you:   Lab Orders     CBC no Diff     Hepatitis B core Ab, Total     Hepatitis B Surface Antibody     Hepatitis B Surface Antigen     Liver Profile     Hepatitis A Antibody, IGM   Tests ordered today:  none  Referrals ordered today:   Referral Orders  No referral(s) requested today     I have ordered the following medication/changed the following medications:   Stop the following medications: Medications Discontinued During This Encounter  Medication Reason  . hydrochlorothiazide (MICROZIDE) 12.5 MG capsule      Start the following medications: Meds ordered this encounter  Medications  . naltrexone (DEPADE) 50 MG tablet    Sig: Take 1 tablet (50 mg total) by mouth daily.    Dispense:  30 tablet    Refill:  2  . hydrochlorothiazide (HYDRODIURIL) 25 MG tablet    Sig: Take 1 tablet (25 mg total) by mouth daily.    Dispense:  30 tablet    Refill:  2     Follow up: 1 month    Remember:   Should you have any questions or concerns please call the internal medicine clinic at [phone].     Dellia Cloud, D.O. [facility]

## 2020-02-10 NOTE — Progress Notes (Signed)
CC: HTN  HPI:  Mr.Carlous Alldredge is a 59 y.o. male with a past medical history stated below and presents today for HTN. Please see problem based assessment and plan for additional details.  Past Medical History:  Diagnosis Date  . Gout   . Hyperlipidemia   . Hypertension     Current Outpatient Medications on File Prior to Visit  Medication Sig Dispense Refill  . allopurinol (ZYLOPRIM) 300 MG tablet Take 1 tablet (300 mg total) by mouth daily. 90 tablet 1  . aspirin 81 MG chewable tablet Chew 1 tablet (81 mg total) by mouth daily. 30 tablet 11  . atorvastatin (LIPITOR) 20 MG tablet TAKE 1 TABLET (20 MG TOTAL) BY MOUTH DAILY 90 tablet 1  . diclofenac sodium (VOLTAREN) 1 % GEL Apply 4 g topically 2 (two) times daily. 400 g 4  . hydrochlorothiazide (MICROZIDE) 12.5 MG capsule Take 1 capsule (12.5 mg total) by mouth daily. 30 capsule 11  . olmesartan (BENICAR) 20 MG tablet Take 1 tablet (20 mg total) by mouth daily. 30 tablet 2  . sildenafil (VIAGRA) 50 MG tablet Take 1 tablet (50 mg total) by mouth as needed for erectile dysfunction. 5 tablet 1  . triamcinolone (KENALOG) 0.025 % ointment Apply 1 application topically 2 (two) times daily. 80 g 3  . triamcinolone ointment (KENALOG) 0.1 % APPLY 1 APPLICATION TO THE AFFECTED AREA(S) TWICE A DAY AS DIRECTED 30 g 0  . white petrolatum (VASELINE) OINT Apply 1 application topically as needed for lip care. 1000 g 0  . [DISCONTINUED] triamcinolone ointment (KENALOG) 0.1 % Apply 1 application topically 2 (two) times daily. 30 g 0   No current facility-administered medications on file prior to visit.    Family History  Problem Relation Age of Onset  . Colon cancer Sister     Social History   Socioeconomic History  . Marital status: Married    Spouse name: Not on file  . Number of children: Not on file  . Years of education: Not on file  . Highest education level: Not on file  Occupational History  . Occupation: [employer]  Tobacco Use  .  Smoking status: Never Smoker  . Smokeless tobacco: Never Used  Substance and Sexual Activity  . Alcohol use: Yes    Alcohol/week: 0.0 standard drinks    Comment: Sometimes.  . Drug use: No  . Sexual activity: Not on file  Other Topics Concern  . Not on file  Social History Narrative  . Not on file   Social Determinants of Health   Financial Resource Strain:   . Difficulty of Paying Living Expenses: Not on file  Food Insecurity:   . Worried About Programme researcher, broadcasting/film/video in the Last Year: Not on file  . Ran Out of Food in the Last Year: Not on file  Transportation Needs:   . Lack of Transportation (Medical): Not on file  . Lack of Transportation (Non-Medical): Not on file  Physical Activity:   . Days of Exercise per Week: Not on file  . Minutes of Exercise per Session: Not on file  Stress:   . Feeling of Stress : Not on file  Social Connections:   . Frequency of Communication with Friends and Family: Not on file  . Frequency of Social Gatherings with Friends and Family: Not on file  . Attends Religious Services: Not on file  . Active Member of Clubs or Organizations: Not on file  . Attends Banker Meetings:  Not on file  . Marital Status: Not on file  Intimate Partner Violence:   . Fear of Current or Ex-Partner: Not on file  . Emotionally Abused: Not on file  . Physically Abused: Not on file  . Sexually Abused: Not on file    Review of Systems: ROS negative except for what is noted on the assessment and plan.  Vitals:   02/10/20 0933  BP: (!) 135/104  Pulse: 96  Temp: 98 F (36.7 C)  TempSrc: Oral  SpO2: 100%  Weight: 207 lb 9.6 oz (94.2 kg)  Height: 6' (1.829 m)     Physical Exam: Physical Exam Constitutional:      Appearance: Normal appearance.  HENT:     Head: Normocephalic and atraumatic.  Cardiovascular:     Rate and Rhythm: Normal rate.     Pulses: Normal pulses.     Heart sounds: Normal heart sounds.  Pulmonary:     Effort: Pulmonary  effort is normal.     Breath sounds: Normal breath sounds.  Abdominal:     General: Bowel sounds are normal.     Palpations: Abdomen is soft.     Tenderness: There is no abdominal tenderness.  Musculoskeletal:        General: Normal range of motion.     Cervical back: Normal range of motion.     Right lower leg: No edema.     Left lower leg: No edema.  Skin:    General: Skin is warm and dry.  Neurological:     Mental Status: He is alert and oriented to person, place, and time. Mental status is at baseline.  Psychiatric:        Mood and Affect: Mood normal.      Assessment & Plan:   See Encounters Tab for problem based charting.  Patient discussed with Dr. Jaynie Crumble, D.O. [facility] Internal Medicine, PGY-2 Pager: [phone], Phone: [phone] Date 02/10/2020 Time 9:38 AM

## 2020-02-10 NOTE — Assessment & Plan Note (Addendum)
Patient presents today for reevaluation of his blood pressure.  At his last visit patient was restarted on hydrochlorothiazide 12.5 mg and losartan 20 mg as he was out of this medication for several weeks.  His blood pressure at that time was 164/108.  Today's blood pressure is 135/104.  Patient states that he is taking his medications as prescribed.    Plan: - Repeat BMP - Increase HCTZ to 25 mg

## 2020-02-11 ENCOUNTER — Encounter: Payer: Self-pay | Admitting: Internal Medicine

## 2020-02-11 DIAGNOSIS — F1010 Alcohol abuse, uncomplicated: Secondary | ICD-10-CM | POA: Insufficient documentation

## 2020-02-11 DIAGNOSIS — F1090 Alcohol use, unspecified, uncomplicated: Secondary | ICD-10-CM | POA: Insufficient documentation

## 2020-02-11 LAB — CBC
Hematocrit: 43.4 % (ref 37.5–51.0)
Hemoglobin: 15.3 g/dL (ref 13.0–17.7)
MCH: 30.1 pg (ref 26.6–33.0)
MCHC: 35.3 g/dL (ref 31.5–35.7)
MCV: 85 fL (ref 79–97)
Platelets: 227 10*3/uL (ref 150–450)
RBC: 5.08 x10E6/uL (ref 4.14–5.80)
RDW: 13.6 % (ref 11.6–15.4)
WBC: 4.7 10*3/uL (ref 3.4–10.8)

## 2020-02-11 LAB — HEPATIC FUNCTION PANEL
ALT: 27 IU/L (ref 0–44)
AST: 30 IU/L (ref 0–40)
Albumin: 4.7 g/dL (ref 3.8–4.9)
Alkaline Phosphatase: 63 IU/L (ref 44–121)
Bilirubin Total: 0.5 mg/dL (ref 0.0–1.2)
Bilirubin, Direct: 0.18 mg/dL (ref 0.00–0.40)
Total Protein: 7.8 g/dL (ref 6.0–8.5)

## 2020-02-11 LAB — HEPATITIS B SURFACE ANTIBODY,QUALITATIVE: Hep B Surface Ab, Qual: NONREACTIVE

## 2020-02-11 LAB — HEPATITIS B CORE ANTIBODY, TOTAL: Hep B Core Total Ab: NEGATIVE

## 2020-02-11 LAB — HEPATITIS B SURFACE ANTIGEN: Hepatitis B Surface Ag: NEGATIVE

## 2020-02-11 LAB — HEPATITIS A ANTIBODY, IGM: Hep A IgM: NEGATIVE

## 2020-02-11 NOTE — Assessment & Plan Note (Signed)
Patient presents today for initial evaluation for ETOH use disorder. Patient states that he drinks roughly 2-3 beers daily (24 once). He believes that it is interfering with his life. His wife has states some concerning regarding the amount that he is drinking. He denies needing a drink in the morning. He denies a history of ETOH withdrawal sxs. I counseled him regarding appropriate drinking quantity and discussed reasonable goals for cutting back on drinking. I will start him on naltrexone today.   Plan: - Test Bmp, liver panel, HBV and HAV labs  - Start Naltrexone 50 mg today.

## 2020-02-13 NOTE — Progress Notes (Signed)
Internal Medicine Clinic Attending  Case discussed with Dr. Coe  At the time of the visit.  We reviewed the resident's history and exam and pertinent patient test results.  I agree with the assessment, diagnosis, and plan of care documented in the resident's note.  

## 2020-02-29 MED FILL — OLMESARTAN MEDOXOMIL 20 MG: 20 | 30 days supply | Qty: 30 | Fill #1

## 2020-03-01 NOTE — Addendum Note (Signed)
Addended by: Neomia Dear on: 03/01/2020 05:11 PM   Modules accepted: Orders

## 2020-03-12 ENCOUNTER — Encounter: Payer: BC Managed Care – PPO | Admitting: Internal Medicine

## 2020-03-12 ENCOUNTER — Telehealth: Payer: Self-pay | Admitting: *Deleted

## 2020-03-12 NOTE — Telephone Encounter (Signed)
Pt was no show for his appt this am. Called pt - pt's wife answered/stated he forgot, he's at work. Informed pt's wife to have pt call us back to re-schedule his appt; stated she will.

## 2020-03-13 MED FILL — HYDROCHLOROTHIAZIDE 25 MG T: 25 | 30 days supply | Qty: 30 | Fill #1

## 2020-03-15 ENCOUNTER — Encounter: Payer: Self-pay | Admitting: Student

## 2020-03-15 ENCOUNTER — Other Ambulatory Visit: Payer: Self-pay | Admitting: Student

## 2020-03-15 ENCOUNTER — Other Ambulatory Visit: Payer: Self-pay

## 2020-03-15 ENCOUNTER — Ambulatory Visit (INDEPENDENT_AMBULATORY_CARE_PROVIDER_SITE_OTHER): Payer: BC Managed Care – PPO | Admitting: Student

## 2020-03-15 VITALS — BP 136/85 | HR 83 | Temp 98.0°F

## 2020-03-15 DIAGNOSIS — I10 Essential (primary) hypertension: Secondary | ICD-10-CM | POA: Diagnosis not present

## 2020-03-15 DIAGNOSIS — M1000 Idiopathic gout, unspecified site: Secondary | ICD-10-CM

## 2020-03-15 DIAGNOSIS — F1010 Alcohol abuse, uncomplicated: Secondary | ICD-10-CM

## 2020-03-15 DIAGNOSIS — L230 Allergic contact dermatitis due to metals: Secondary | ICD-10-CM

## 2020-03-15 DIAGNOSIS — L309 Dermatitis, unspecified: Secondary | ICD-10-CM

## 2020-03-15 MED ORDER — TRIAMCINOLONE ACETONIDE 0.1 % EX OINT: TOPICAL_OINTMENT | Freq: Two times a day (BID) | CUTANEOUS | 0 refills | Status: DC

## 2020-03-15 MED ORDER — COLCHICINE 0.6 MG PO TABS: 0.6000 mg | ORAL_TABLET | Freq: Every day | ORAL | 0 refills | Status: DC

## 2020-03-15 MED ORDER — ALLOPURINOL 100 MG PO TABS: 100.0000 mg | ORAL_TABLET | Freq: Every day | ORAL | 0 refills | Status: DC

## 2020-03-15 MED FILL — ALLOPURINOL 100 MG TABS: 100 | 30 days supply | Qty: 30 | Fill #0

## 2020-03-15 MED FILL — COLCHICINE 0.6 MG TABS: 0.6 | 30 days supply | Qty: 30 | Fill #0

## 2020-03-15 MED FILL — TRIAMCINOLONE 0.1% OINTMENT: 0.1 | 30 days supply | Qty: 454 | Fill #0

## 2020-03-15 NOTE — Assessment & Plan Note (Signed)
Patient has history of gout previously managed with allopurinol 300mg  daily for years. He reports running out of this medication approximately two months ago, however he has not had any gout flares recently (last flare seven months ago).  Due to patient's current antihypertensive regimen (HCTZ) and lack of allopurinol, he is at high risk for a repeat gout flare. As he has been off of this medication regimen for two months, we will have to reintroduce allopurinol at a low-dose with colchicine to prevent an acute gout flare. -Restart allopurinol 100mg  daily (consider increasing at next office visit) -Start colchicine 0.6mg  daily (patient should take for 6-8 weeks) -Uric acid level today

## 2020-03-15 NOTE — Progress Notes (Signed)
    CC: 21-month follow-up  HPI:  [name] is a 59 y.o. with past medical history significant for HTN, HLD, gout, and mild alcohol use disorder who presents to clinic for one month follow-up. Please refer to problem list for charting of this encounter.  Past Medical History:  Diagnosis Date  . Gout   . Hyperlipidemia   . Hypertension    Family History  Problem Relation Age of Onset  . Colon cancer Sister    Social History   Tobacco Use  . Smoking status: Never Smoker  . Smokeless tobacco: Never Used  Substance Use Topics  . Alcohol use: Yes    Alcohol/week: 0.0 standard drinks    Comment: Sometimes.  . Drug use: No   Review of Systems:  Denies headaches, chest pain, shortness of breath, fevers, chills.  Physical Exam:  Vitals:   03/15/20 1500  BP: 136/85  Pulse: 83  Temp: 98 F (36.7 C)  TempSrc: Oral  SpO2: 100%   Physical Exam Vitals and nursing note reviewed. Exam conducted with a chaperone present.  Cardiovascular:     Rate and Rhythm: Normal rate and regular rhythm.     Pulses: Normal pulses.     Heart sounds: Normal heart sounds.  Pulmonary:     Effort: Pulmonary effort is normal.     Breath sounds: Normal breath sounds.  Abdominal:     General: Abdomen is flat. Bowel sounds are normal.     Palpations: Abdomen is soft.     Tenderness: There is no abdominal tenderness.  Skin:    Comments: Erythematous, lichenified plaque on lower abdomen    Assessment & Plan:   See Encounters Tab for problem based charting.  Patient seen with Dr. Sandre Kitty

## 2020-03-15 NOTE — Assessment & Plan Note (Signed)
Patient endorses itchy rash over his beltline that has been present for years. He reports that he applies triamcinolone ointment to the rash, however it has never completely resolved.  OBJECTIVE: Lichenified, erythematous plaque over anterior abdomen, directly above beltline  ASSESSMENT/PLAN: Although patient has a history of atopic dermatitis, the location of this rash is inconsistent with atopic dermatitis. He is wearing a nickel-plated belt at the time of this office visit and reports that his belt prior to this one also had a nickel buckle. Given the location and appearance of the rash, primary concern is an allergic contact dermatitis particularly to nickel. -Continue triamcinolone ointment twice daily -Wear undershirt tucked into pants -Change out belt to a non-metal buckle

## 2020-03-15 NOTE — Patient Instructions (Addendum)
Mr. Kuhnert,  It was a pleasure meeting you in clinic today.  For your high blood pressure, your blood pressure looks great today. Please, continue taking the HCTZ and olmesartan every day. We will collect a basic metabolic panel to look at your kidney function and electrolytes.   For your alcohol use, continue taking the naltrexone which should help suppress your cravings. Keep working to cut down your consumption.  For your history of gout, we will start you back on allopurinol, however we will start a medication called colchicine at the same time to prevent flares. We will check your uric acid level today.  Sincerely, Dr. Jasmine December, MD

## 2020-03-15 NOTE — Assessment & Plan Note (Signed)
Patient's blood pressure well-controlled today (136/85). At last month's appointment, his HCTZ was increased from 12.5mg  daily to 25mg  daily and he was continued on olmesartan 20mg  daily. He reports complete adherence to this regimen without complication.  Although patient's blood pressure is well-controlled, HCTZ may not be the best option for control of this patient's blood pressure given his history of gout. Nonetheless, we will continue this regimen at this time as he is tolerating it well and denies any recent flares of gout despite being off of allopurinol. -BMP today -Continue HCTZ 25mg  daily -Continue olmesartan 20mg  daily

## 2020-03-15 NOTE — Assessment & Plan Note (Signed)
Patient reports cutting back on drinking "a lot" but unable to quantify exactly how much since his last visit. He states that he takes the naltrexone as prescribed. -Continue naltrexone -Continue weaning alcohol use

## 2020-03-16 LAB — BMP8+ANION GAP
Anion Gap: 13 mmol/L (ref 10.0–18.0)
BUN/Creatinine Ratio: 28 — ABNORMAL HIGH (ref 9–20)
BUN: 35 mg/dL — ABNORMAL HIGH (ref 6–24)
CO2: 21 mmol/L (ref 20–29)
Calcium: 9 mg/dL (ref 8.7–10.2)
Chloride: 104 mmol/L (ref 96–106)
Creatinine, Ser: 1.23 mg/dL (ref 0.76–1.27)
GFR calc Af Amer: 75 mL/min/{1.73_m2} (ref >59)
GFR calc non Af Amer: 65 mL/min/{1.73_m2} (ref >59)
Glucose: 94 mg/dL (ref 65–99)
Potassium: 5.1 mmol/L (ref 3.5–5.2)
Sodium: 138 mmol/L (ref 134–144)

## 2020-03-16 LAB — URIC ACID: Uric Acid: 9.1 mg/dL — ABNORMAL HIGH (ref 3.8–8.4)

## 2020-03-27 NOTE — Progress Notes (Signed)
Internal Medicine Clinic Attending  I saw and evaluated the patient.  I personally confirmed the key portions of the history and exam documented by Dr. Laural Benes and I reviewed pertinent patient test results.  The assessment, diagnosis, and plan were formulated together and I agree with the documentation in the resident's note.  Uric acid is quite elevated, was previously well controlled on allopurinol, will restart with colchicine to prevent flares, will also benefit from switching HCTZ to alternative antihypertensive.  Jessy Oto, M.D., Ph.D.

## 2020-04-16 ENCOUNTER — Encounter: Payer: BC Managed Care – PPO | Admitting: Internal Medicine

## 2020-04-17 MED FILL — HYDROCHLOROTHIAZIDE 25 MG T: 25 | 30 days supply | Qty: 30 | Fill #2

## 2020-04-17 MED FILL — ALLOPURINOL 300 MG TABS: 300 | 90 days supply | Qty: 90 | Fill #1

## 2020-04-17 MED FILL — OLMESARTAN MEDOXOMIL 20 MG: 20 | 30 days supply | Qty: 30 | Fill #2

## 2020-04-27 MED FILL — OLMESARTAN MEDOXOMIL 20 MG: 20 | 30 days supply | Qty: 30 | Fill #2

## 2020-04-27 MED FILL — ALLOPURINOL 100 MG TABS: 100 | 30 days supply | Qty: 30 | Fill #0

## 2020-04-27 MED FILL — HYDROCHLOROTHIAZIDE 25 MG T: 25 | 30 days supply | Qty: 30 | Fill #2

## 2020-05-29 ENCOUNTER — Other Ambulatory Visit: Payer: Self-pay | Admitting: Internal Medicine

## 2020-05-29 ENCOUNTER — Other Ambulatory Visit: Payer: Self-pay | Admitting: Student

## 2020-05-29 DIAGNOSIS — I10 Essential (primary) hypertension: Secondary | ICD-10-CM

## 2020-05-29 MED FILL — OLMESARTAN MEDOXOMIL 20 MG: 20 | 30 days supply | Qty: 30 | Fill #0

## 2020-05-30 MED FILL — HYDROCHLOROTHIAZIDE 25 MG T: 25 | 30 days supply | Qty: 30 | Fill #0

## 2020-06-12 MED FILL — HYDROCHLOROTHIAZIDE 25 MG T: 25 | 30 days supply | Qty: 30 | Fill #0

## 2020-06-12 MED FILL — OLMESARTAN MEDOXOMIL 20 MG: 20 | 30 days supply | Qty: 30 | Fill #0

## 2020-07-13 ENCOUNTER — Encounter: Payer: Self-pay | Admitting: Internal Medicine

## 2020-07-14 ENCOUNTER — Other Ambulatory Visit (HOSPITAL_COMMUNITY): Payer: Self-pay

## 2020-07-17 ENCOUNTER — Encounter: Payer: Self-pay | Admitting: Internal Medicine

## 2020-07-19 ENCOUNTER — Other Ambulatory Visit (HOSPITAL_COMMUNITY): Payer: Self-pay

## 2020-07-19 ENCOUNTER — Ambulatory Visit (INDEPENDENT_AMBULATORY_CARE_PROVIDER_SITE_OTHER): Payer: BC Managed Care – PPO | Admitting: Student

## 2020-07-19 ENCOUNTER — Other Ambulatory Visit: Payer: Self-pay

## 2020-07-19 ENCOUNTER — Encounter: Payer: Self-pay | Admitting: Student

## 2020-07-19 VITALS — BP 158/113 | HR 82 | Temp 97.6°F | Ht 72.0 in | Wt 216.8 lb

## 2020-07-19 DIAGNOSIS — M79672 Pain in left foot: Secondary | ICD-10-CM | POA: Diagnosis not present

## 2020-07-19 DIAGNOSIS — M79671 Pain in right foot: Secondary | ICD-10-CM

## 2020-07-19 DIAGNOSIS — I10 Essential (primary) hypertension: Secondary | ICD-10-CM

## 2020-07-19 DIAGNOSIS — M1A00X0 Idiopathic chronic gout, unspecified site, without tophus (tophi): Secondary | ICD-10-CM | POA: Diagnosis not present

## 2020-07-19 HISTORY — DX: Pain in left foot: M79.671

## 2020-07-19 MED ORDER — HYDROCHLOROTHIAZIDE 25 MG PO TABS
25.0000 mg | ORAL_TABLET | Freq: Every day | ORAL | 2 refills | Status: DC
  Filled 2020-07-19: qty 90, 90d supply, fill #0
  Filled 2020-10-24: qty 90, 90d supply, fill #1
  Filled 2021-02-01: qty 90, 90d supply, fill #2

## 2020-07-19 MED ORDER — OLMESARTAN MEDOXOMIL 20 MG PO TABS
20.0000 mg | ORAL_TABLET | Freq: Every day | ORAL | 2 refills | Status: DC
  Filled 2020-07-19: qty 90, 90d supply, fill #0
  Filled 2020-10-24: qty 90, 90d supply, fill #1
  Filled 2021-02-01: qty 90, 90d supply, fill #2

## 2020-07-19 MED ORDER — COLCHICINE 0.6 MG PO TABS
0.6000 mg | ORAL_TABLET | Freq: Every day | ORAL | 0 refills | Status: DC
  Filled 2020-07-19: qty 30, 30d supply, fill #0

## 2020-07-19 NOTE — Assessment & Plan Note (Signed)
Patient with 2-day history of foot soreness/aching. He reports that it started up after starting his 2nd job (now works 2 jobs that require constant movement and staying on his feet). He reports that the sides of his feet are aching when he has to stand on them for long periods of time. On exam, he has calluses on bilateral feet but no obvious deformities or tenderness to palpation. He does state that his shoes are old and that he needs new ones. Discussed obtaining new shoes and/or shoe soles and applying ice to help with soreness.

## 2020-07-19 NOTE — Assessment & Plan Note (Addendum)
Today's Vitals   07/19/20 0901 07/19/20 0902  BP:  (!) 158/113  Pulse:  82  Temp:  97.6 F (36.4 C)  TempSrc:  Oral  SpO2:  100%  Weight: 216 lb 12.8 oz (98.3 kg)   Height: 6' (1.829 m)   PainSc:  9    Body mass index is 29.4 kg/m.  Patient with history of HTN, on olmesartan 20mg  daily and HCTZ 25mg  daily. BP 158/113 today. He reports that he ran out of BP medications a few days ago and did not have any refills. Upon chart review, BP was well controlled with current regimen so will refill BP medications and reassess BP at next visit.  Plan: -refilled olmesartan and HCTZ at current dose -reassess BP at next visit

## 2020-07-19 NOTE — Assessment & Plan Note (Signed)
Was restarted on allopurinol 100mg  daily and colchicine during last office visit for continued prevention of gout flares. He has not had any flares for multiple years now as per patient. Will obtain uric acid level today to see if it is <6 with current allopurinol dose. If elevated, will need to titrate up allopurinol dose until he is at goal. Will continue colchicine at this time until uric acid levels return in case allopurinol dose needs to be readjusted.  Plan: -f/u uric acid levels -continue allopurinol 100mg  daily and colchicine at this time

## 2020-07-19 NOTE — Patient Instructions (Signed)
Tyler Burch,  It was a pleasure seeing you in the clinic today.   I have refilled your blood pressure medications. We are checking your uric acid levels to make sure your gout is well controlled. Please try to get new shoes and/or gel padding for your shoes to help with your feet soreness.  Please call our clinic at [phone] if you have any questions or concerns. The best time to call is Monday-Friday from 9am-4pm, but there is someone available 24/7 at the same number. If you need medication refills, please notify your pharmacy one week in advance and they will send [place] a request.   Thank you for letting us take part in your care. We look forward to seeing you next time!

## 2020-07-19 NOTE — Progress Notes (Signed)
   CC: foot soreness and refills of antihypertensive medications  HPI:  Tyler Burch is a 60 y.o. male with history listed below presenting to the [facility] for foot soreness and refills of antihypertensive medications. Please see individualized problem based charting for full HPI.  Past Medical History:  Diagnosis Date  . Gout   . Hyperlipidemia   . Hypertension     Review of Systems:  Negative aside from that listed in individualized problem based charting.  Physical Exam:  Vitals:   07/19/20 0901 07/19/20 0902  BP:  (!) 158/113  Pulse:  82  Temp:  97.6 F (36.4 C)  TempSrc:  Oral  SpO2:  100%  Weight: 216 lb 12.8 oz (98.3 kg)   Height: 6' (1.829 m)    Physical Exam Constitutional:      Appearance: Normal appearance. He is not ill-appearing.  HENT:     Head: Normocephalic and atraumatic.     Nose: Nose normal. No congestion.     Mouth/Throat:     Mouth: Mucous membranes are moist.     Pharynx: Oropharynx is clear. No oropharyngeal exudate.  Eyes:     Extraocular Movements: Extraocular movements intact.     Conjunctiva/sclera: Conjunctivae normal.     Pupils: Pupils are equal, round, and reactive to light.  Cardiovascular:     Rate and Rhythm: Normal rate and regular rhythm.     Pulses: Normal pulses.     Heart sounds: Normal heart sounds. No murmur heard. No friction rub. No gallop.   Pulmonary:     Effort: Pulmonary effort is normal.     Breath sounds: Normal breath sounds. No wheezing, rhonchi or rales.  Abdominal:     General: Bowel sounds are normal.     Palpations: Abdomen is soft.     Tenderness: There is no abdominal tenderness.  Musculoskeletal:        General: No swelling. Normal range of motion.     Cervical back: Normal range of motion.  Skin:    General: Skin is warm and dry.     Comments: Calluses on bilateral feet, no TTP on exam.  Neurological:     General: No focal deficit present.     Mental Status: He is alert.     Motor: No weakness.   Psychiatric:        Mood and Affect: Mood normal.        Behavior: Behavior normal.      Assessment & Plan:   See Encounters Tab for problem based charting.  Patient discussed with Dr. Sandre Kitty

## 2020-07-20 LAB — URIC ACID: Uric Acid: 7.5 mg/dL (ref 3.8–8.4)

## 2020-07-20 NOTE — Progress Notes (Signed)
Internal Medicine Clinic Attending  Case discussed with Dr. Austin Miles at the time of the visit.  We reviewed the resident's history and exam and pertinent patient test results.  I agree with the assessment, diagnosis, and plan of care documented in the resident's note.  Jessy Oto, M.D., Ph.D.

## 2020-07-30 ENCOUNTER — Telehealth: Payer: Self-pay | Admitting: Student

## 2020-07-30 ENCOUNTER — Other Ambulatory Visit (HOSPITAL_COMMUNITY): Payer: Self-pay

## 2020-07-30 DIAGNOSIS — M1000 Idiopathic gout, unspecified site: Secondary | ICD-10-CM

## 2020-07-30 MED ORDER — ALLOPURINOL 100 MG PO TABS
200.0000 mg | ORAL_TABLET | Freq: Every day | ORAL | 2 refills | Status: DC
  Filled 2020-07-30 – 2021-01-17 (×2): qty 60, 30d supply, fill #0
  Filled 2021-05-20 – 2021-05-28 (×2): qty 60, 30d supply, fill #1

## 2020-07-30 NOTE — Telephone Encounter (Signed)
Patient's uric acid came back at 7.5 which is still not at goal of less than 6. Called patient to discuss plan to increase allopurinol from 100 mg daily to 200 mg daily. Unable to reach patient but spoke with spouse Magda Paganini) who plans to relay information to patient. Changed Allopurinol prescription to 200 mg (2 100 mg tabs) daily. Instructed to follow up with repeat labs in 2-3 months.

## 2020-07-30 NOTE — Progress Notes (Signed)
I increased his Allopurinol to 200 mg daily and asked his spouse to rely this information to him. He will need a follow up appt in a few months to recheck.

## 2020-08-07 ENCOUNTER — Other Ambulatory Visit (HOSPITAL_COMMUNITY): Payer: Self-pay

## 2020-10-24 ENCOUNTER — Other Ambulatory Visit (HOSPITAL_COMMUNITY): Payer: Self-pay

## 2021-01-17 ENCOUNTER — Other Ambulatory Visit (HOSPITAL_COMMUNITY): Payer: Self-pay

## 2021-02-01 ENCOUNTER — Other Ambulatory Visit (HOSPITAL_COMMUNITY): Payer: Self-pay

## 2021-03-20 ENCOUNTER — Encounter: Payer: BC Managed Care – PPO | Admitting: Internal Medicine

## 2021-03-26 ENCOUNTER — Ambulatory Visit (INDEPENDENT_AMBULATORY_CARE_PROVIDER_SITE_OTHER): Payer: BC Managed Care – PPO | Admitting: Internal Medicine

## 2021-03-26 ENCOUNTER — Encounter: Payer: Self-pay | Admitting: Internal Medicine

## 2021-03-26 ENCOUNTER — Other Ambulatory Visit: Payer: Self-pay

## 2021-03-26 VITALS — BP 137/98 | HR 83 | Temp 97.8°F | Ht 72.0 in | Wt 213.1 lb

## 2021-03-26 DIAGNOSIS — E782 Mixed hyperlipidemia: Secondary | ICD-10-CM

## 2021-03-26 DIAGNOSIS — M1000 Idiopathic gout, unspecified site: Secondary | ICD-10-CM | POA: Diagnosis not present

## 2021-03-26 DIAGNOSIS — I129 Hypertensive chronic kidney disease with stage 1 through stage 4 chronic kidney disease, or unspecified chronic kidney disease: Secondary | ICD-10-CM | POA: Diagnosis not present

## 2021-03-26 DIAGNOSIS — R7303 Prediabetes: Secondary | ICD-10-CM | POA: Diagnosis not present

## 2021-03-26 DIAGNOSIS — I10 Essential (primary) hypertension: Secondary | ICD-10-CM

## 2021-03-26 DIAGNOSIS — Z0000 Encounter for general adult medical examination without abnormal findings: Secondary | ICD-10-CM

## 2021-03-26 DIAGNOSIS — Z23 Encounter for immunization: Secondary | ICD-10-CM | POA: Diagnosis not present

## 2021-03-26 DIAGNOSIS — N182 Chronic kidney disease, stage 2 (mild): Secondary | ICD-10-CM

## 2021-03-26 LAB — POCT GLYCOSYLATED HEMOGLOBIN (HGB A1C): Hemoglobin A1C: 5.5 % (ref 4.0–5.6)

## 2021-03-26 LAB — GLUCOSE, CAPILLARY: Glucose-Capillary: 106 mg/dL — ABNORMAL HIGH (ref 70–99)

## 2021-03-26 NOTE — Assessment & Plan Note (Signed)
Report of dry mouth and increased thirst may indicate progression of pre-diabetes. A1c today on re-check was 5.5, unchanged from prior. Will continue to monitor  - A1c today 5.5 - Glucose level

## 2021-03-26 NOTE — Assessment & Plan Note (Signed)
BP today 140/95. He mentions that his blood pressure is normally elevated when he comes into the office, but is lower following re-check. BP after re-check 137/98, with minimal decrease in blood pressure, suggesting his BP could be better controlled. It is very possible he has normal pressures at home, and his wife has a cuff but he has not used it. Encouraged him to regularly measure his pressures at home and sent him home with a BP log he can use. Will continue his current BP regimen in the meantime.  - HCTZ 25mg  - Olmesartan 20mg 

## 2021-03-26 NOTE — Assessment & Plan Note (Signed)
Flu vaccine given today. Pneumonia vaccine unavailable.

## 2021-03-26 NOTE — Progress Notes (Signed)
°  Subjective:     Patient ID: Tyler Burch, male   DOB: February 26, 1961, 60 y.o.   MRN: [id number]  Tyler Burch is a 60 y/o who presents to clinic for routine follow-up. He reports some difficulty with vision today, mainly that the cannot see up close, but denies any sudden vision changes, including sudden darkening of his vision or decreased peripheral vision. He says his gout occasionally flares up when he misses doses of his allopurinol for 2 or 3 days in a row. Additionally, he reports increased thirst with a "bad taste in the mouth," and with mouth dryness.   Review of Systems  Endocrine: Positive for polydipsia. Negative for polyuria.      Objective:   Physical Exam Constitutional:      Appearance: Normal appearance.  HENT:     Head: Normocephalic and atraumatic.     Mouth/Throat:     Mouth: Mucous membranes are moist.     Comments: Dry lips but moist mucous membranes. Cardiovascular:     Rate and Rhythm: Normal rate and regular rhythm.     Pulses:          Dorsalis pedis pulses are 2+ on the right side.     Heart sounds: Normal heart sounds.  Pulmonary:     Effort: Pulmonary effort is normal. No respiratory distress.  Abdominal:     General: Abdomen is flat. Bowel sounds are normal. There is no distension.     Palpations: Abdomen is soft.     Tenderness: There is no abdominal tenderness.  Neurological:     Mental Status: He is alert.      Assessment & Plan:     See problem-based charting

## 2021-03-26 NOTE — Progress Notes (Signed)
Attestation for Student Documentation:  I personally was present and performed or re-performed the history, physical exam and medical decision-making activities of this service and have verified that the service and findings are accurately documented in the students note.  Dellia Cloud, MD 03/27/2021, 2:45 PM

## 2021-03-26 NOTE — Assessment & Plan Note (Signed)
Extensive history of hyperlipidemia with ASCVD risk score >10%. Regularly takes his atorvastatin, but has not had a lipid panel since 10/20 so will re-check today.  - Lipid panel. - Continue atorvastatin 20mg .

## 2021-03-26 NOTE — Assessment & Plan Note (Addendum)
He reports some gout flares, but limited to when he hasn't taken his medication for 2 or 3 days. No evidence of acute flare today. His last uric acid level was 7.5 in 4/22, and his allopurinol was increased from 100mg  to 200mg . He has only been taking 100mg , and since he has no reported flares when regularly taking allopurinol, instructed him that 100mg  daily is okay, but to call if his gout worsens. He has also been taking colchicine daily, and instructed him to only take this for flares. Will check a uric acid today.  - Uric acid level - increase Allopurinol 200mg  daily - Counseled to only take colchicine for flares.    **ADDENDUM**  Uric acid level of 11.7

## 2021-03-26 NOTE — Assessment & Plan Note (Signed)
Last creatinine was 1.23 in 12/21, so will re-check with BMP today. His CKD is likely attributable to hypertension, and will continue his current BP regimen.  - BMP

## 2021-03-26 NOTE — Patient Instructions (Signed)
Thank you, [name] for allowing [place] to provide your care today. Today we discussed:  Blood Pressure: - Keep taking your blood pressure medications as prescribed. Take blood pressure readings at home with a goal of <130/80.   Cholesterol: - We will re-check your cholesterol today. Keep taking your atorvastatin medication.  Gout: - You can keep taking the 100mg  of allopurinol. Colchicine only needs to be taken for flares. If you are having increased flares, let know and we can increase your dose of allopurinol  Pre-diabetes: - We will re-check an A1c today.  Flu Shot: -  You received your flu shot today  I have ordered the following labs for you:   Lab Orders         BMP8+Anion Gap         Lipid Profile         Uric acid         POC Hbg A1C      I have ordered the following medication/changed the following medications:   Stop the following medications: There are no discontinued medications.   Start the following medications: No orders of the defined types were placed in this encounter.    Follow up:  6 months to 1 year    Remember: Should you have any questions or concerns please call the internal medicine clinic at [phone].

## 2021-03-27 ENCOUNTER — Encounter: Payer: Self-pay | Admitting: Internal Medicine

## 2021-03-27 LAB — LIPID PANEL
Chol/HDL Ratio: 3 ratio (ref 0.0–5.0)
Cholesterol, Total: 228 mg/dL — ABNORMAL HIGH (ref 100–199)
HDL: 77 mg/dL (ref >39)
LDL Chol Calc (NIH): 119 mg/dL — ABNORMAL HIGH (ref 0–99)
Triglycerides: 189 mg/dL — ABNORMAL HIGH (ref 0–149)
VLDL Cholesterol Cal: 32 mg/dL (ref 5–40)

## 2021-03-27 LAB — BMP8+ANION GAP
Anion Gap: 16 mmol/L (ref 10.0–18.0)
BUN/Creatinine Ratio: 25 — ABNORMAL HIGH (ref 9–20)
BUN: 35 mg/dL — ABNORMAL HIGH (ref 6–24)
CO2: 18 mmol/L — ABNORMAL LOW (ref 20–29)
Calcium: 9.4 mg/dL (ref 8.7–10.2)
Chloride: 103 mmol/L (ref 96–106)
Creatinine, Ser: 1.41 mg/dL — ABNORMAL HIGH (ref 0.76–1.27)
Glucose: 91 mg/dL (ref 70–99)
Potassium: 4.7 mmol/L (ref 3.5–5.2)
Sodium: 137 mmol/L (ref 134–144)
eGFR: 58 mL/min/{1.73_m2} — ABNORMAL LOW (ref >59)

## 2021-03-27 LAB — URIC ACID: Uric Acid: 11.6 mg/dL — ABNORMAL HIGH (ref 3.8–8.4)

## 2021-05-20 ENCOUNTER — Other Ambulatory Visit (HOSPITAL_COMMUNITY): Payer: Self-pay

## 2021-05-28 ENCOUNTER — Other Ambulatory Visit (HOSPITAL_COMMUNITY): Payer: Self-pay

## 2021-05-28 ENCOUNTER — Other Ambulatory Visit: Payer: Self-pay | Admitting: Student

## 2021-05-28 DIAGNOSIS — I10 Essential (primary) hypertension: Secondary | ICD-10-CM

## 2021-05-29 MED ORDER — OLMESARTAN MEDOXOMIL 20 MG PO TABS
20.0000 mg | ORAL_TABLET | Freq: Every day | ORAL | 2 refills | Status: DC
  Filled 2021-05-29: qty 90, 90d supply, fill #0
  Filled 2021-09-19 – 2021-09-30 (×2): qty 90, 90d supply, fill #1
  Filled 2021-12-25: qty 90, 90d supply, fill #2

## 2021-05-29 MED ORDER — HYDROCHLOROTHIAZIDE 25 MG PO TABS
25.0000 mg | ORAL_TABLET | Freq: Every day | ORAL | 2 refills | Status: DC
  Filled 2021-05-29: qty 90, 90d supply, fill #0
  Filled 2021-09-19 – 2021-09-30 (×2): qty 90, 90d supply, fill #1

## 2021-05-30 ENCOUNTER — Other Ambulatory Visit (HOSPITAL_COMMUNITY): Payer: Self-pay

## 2021-06-07 ENCOUNTER — Other Ambulatory Visit (HOSPITAL_COMMUNITY): Payer: Self-pay

## 2021-06-10 ENCOUNTER — Other Ambulatory Visit (HOSPITAL_COMMUNITY): Payer: Self-pay

## 2021-06-10 MED ORDER — PREDNISONE 20 MG PO TABS
60.0000 mg | ORAL_TABLET | Freq: Every day | ORAL | 1 refills | Status: DC
  Filled 2021-06-10: qty 18, 6d supply, fill #0

## 2021-06-10 MED ORDER — INDOMETHACIN 50 MG PO CAPS
50.0000 mg | ORAL_CAPSULE | Freq: Three times a day (TID) | ORAL | 0 refills | Status: DC | PRN
  Filled 2021-06-10: qty 30, 10d supply, fill #0

## 2021-07-31 ENCOUNTER — Other Ambulatory Visit (HOSPITAL_COMMUNITY): Payer: Self-pay

## 2021-07-31 ENCOUNTER — Other Ambulatory Visit: Payer: Self-pay | Admitting: Student

## 2021-07-31 DIAGNOSIS — M1000 Idiopathic gout, unspecified site: Secondary | ICD-10-CM

## 2021-08-01 ENCOUNTER — Other Ambulatory Visit (HOSPITAL_COMMUNITY): Payer: Self-pay

## 2021-08-01 MED ORDER — ALLOPURINOL 100 MG PO TABS
200.0000 mg | ORAL_TABLET | Freq: Every day | ORAL | 2 refills | Status: DC
  Filled 2021-08-01 – 2021-08-26 (×2): qty 60, 30d supply, fill #0

## 2021-08-09 ENCOUNTER — Other Ambulatory Visit (HOSPITAL_COMMUNITY): Payer: Self-pay

## 2021-08-23 ENCOUNTER — Other Ambulatory Visit (HOSPITAL_COMMUNITY): Payer: Self-pay

## 2021-08-23 ENCOUNTER — Other Ambulatory Visit: Payer: Self-pay | Admitting: Student

## 2021-08-23 DIAGNOSIS — L230 Allergic contact dermatitis due to metals: Secondary | ICD-10-CM

## 2021-08-23 MED ORDER — TRIAMCINOLONE ACETONIDE 0.1 % EX OINT
TOPICAL_OINTMENT | Freq: Two times a day (BID) | CUTANEOUS | 0 refills | Status: AC
  Filled 2021-08-23: qty 454, 30d supply, fill #0

## 2021-08-26 ENCOUNTER — Other Ambulatory Visit (HOSPITAL_COMMUNITY): Payer: Self-pay

## 2021-09-19 ENCOUNTER — Other Ambulatory Visit (HOSPITAL_COMMUNITY): Payer: Self-pay

## 2021-09-27 ENCOUNTER — Other Ambulatory Visit (HOSPITAL_COMMUNITY): Payer: Self-pay

## 2021-09-30 ENCOUNTER — Other Ambulatory Visit (HOSPITAL_COMMUNITY): Payer: Self-pay

## 2021-10-05 ENCOUNTER — Encounter: Payer: Self-pay | Admitting: *Deleted

## 2021-12-24 NOTE — Progress Notes (Signed)
CC: follow up  HPI:  Tyler Burch is a 61 y.o. with medical history of HTN, HLD, and Gout presenting to [facility] for a follow up. Last seen in 03/2021.  Please see problem-based list for further details, assessments, and plans.  Past Medical History:  Diagnosis Date   Foot pain, bilateral 07/19/2020   Gout    Hyperlipidemia    Hypertension      Current Outpatient Medications (Cardiovascular):    amLODipine (NORVASC) 5 MG tablet, Take 1 tablet (5 mg total) by mouth daily.   atorvastatin (LIPITOR) 20 MG tablet, Take 1 tablet (20 mg total) by mouth daily.   olmesartan (BENICAR) 20 MG tablet, Take 1 tablet (20 mg total) by mouth daily.   sildenafil (VIAGRA) 50 MG tablet, Take 1 tablet (50 mg total) by mouth as needed for erectile dysfunction.   Current Outpatient Medications (Analgesics):    allopurinol (ZYLOPRIM) 100 MG tablet, Take 2 tablets (200 mg total) by mouth daily.   colchicine 0.6 MG tablet, Take 1 tablet (0.6 mg total) by mouth daily. Only take during gout flare.   Current Outpatient Medications (Other):    triamcinolone ointment (KENALOG) 0.1 %, Apply topically 2 (two) times daily.   white petrolatum (VASELINE) OINT, Apply 1 application topically as needed for lip care.  Review of Systems:  Review of system negative unless stated in the problem list or HPI.    Physical Exam:  Vitals:   12/25/21 0901 12/25/21 0926  BP: (!) 142/89 (!) 137/90  Pulse: 85 79  Temp: 98.4 F (36.9 C)   TempSrc: Oral   SpO2: 100%   Weight: 205 lb 8 oz (93.2 kg)   Height: 6' (1.829 m)     Physical Exam General: NAD HENT: NCAT Lungs: CTAB, no wheeze, rhonchi or rales.  Cardiovascular: Normal heart sounds, no r/m/g, 2+ pulses in all extremities. No LE edema Abdomen: No TTP, normal bowel sounds MSK: No asymmetry or muscle atrophy.  Skin: no lesions noted on exposed skin Neuro: Alert and oriented x4. CN grossly intact Psych: Normal mood and normal affect   Assessment & Plan:    Essential hypertension BP at not at goal. BP in clinic today 142/89, HR 85. Does not monitor BP level at home; educated on the importance of it. Home medications include Olmesartan 20 mg and HCTZ 25 mg qd. Reports good medication compliance. Denies headaches, vision changes, lightheadedness, chest pain, SHOB, leg swelling or changes in speech. Last creatinine was elevated at 1.41 in 03/2021. Counseled on the importance of medication adherence, daily exercise, low salt diet, and weight loss. -Continue Olmesartan 20 mg qd and start amlodipine 5 mg and stop HCTZ 25 mg given gout.  -BP log and bring to next visit  -Continue lifestyle changes -F/u on BMP; BMP shows normal electrolytes but elevated creatinine. Unsure if this is AKI vs CKD but could be CKD given previous elevated levels. Last lab work was consistent with CKDIIIa and this one is as well. Called and encouraged pt to hydrate as we will repeat blood work in 3 weeks.  -Plan to repeat ACR during the next visit.   Hyperlipidemia Patient has HLD with goal of primary prevention. Last lipid panel on 03/2021 showed Chol 228, LDL 119. Reports non-adherence to lipitor 20 mg without adverse effects. Last refilled on 01/25/20. ASCVD risk 14.2. Counseled on the importance of continued lifestyle modifications, including: weight loss, daily exercise, and healthy diet with limited processed and fatty foods.  -Lipid panel ordered; Chol  212 and LDL 117. ASCVD Risk unchanged at 14 %. Since patient has not been complaint with lipitor 20 mg, will continue this medication. Will repeat lipid panel in 3-6 months and uptitrate statin if no improvement.  -Encouraged to continue lifestyle modifications   Gout On Allupurinol 200 mg qd. Last filled on 08/26/21. Uric acid level was 11.6 03/2021. Will restart allupurinol 100 mg qd and repeat uric acid level in 3 weeks to see if uric acid is within therapeutic range. Will start low and titrate slow given some kidney  impairment.   Health care maintenance Flu shot given this visit.   See Encounters Tab for problem based charting.  Patient discussed with Dr. Sheran Lawless, MD Tyler Burch. [facility] Internal Medicine Residency, PGY-2

## 2021-12-25 ENCOUNTER — Other Ambulatory Visit (HOSPITAL_COMMUNITY): Payer: Self-pay

## 2021-12-25 ENCOUNTER — Encounter: Payer: Self-pay | Admitting: Internal Medicine

## 2021-12-25 ENCOUNTER — Ambulatory Visit: Payer: BC Managed Care – PPO | Admitting: Internal Medicine

## 2021-12-25 ENCOUNTER — Other Ambulatory Visit: Payer: Self-pay

## 2021-12-25 VITALS — BP 137/90 | HR 79 | Temp 98.4°F | Ht 72.0 in | Wt 205.5 lb

## 2021-12-25 DIAGNOSIS — I10 Essential (primary) hypertension: Secondary | ICD-10-CM | POA: Diagnosis not present

## 2021-12-25 DIAGNOSIS — E785 Hyperlipidemia, unspecified: Secondary | ICD-10-CM | POA: Diagnosis not present

## 2021-12-25 DIAGNOSIS — M1000 Idiopathic gout, unspecified site: Secondary | ICD-10-CM | POA: Diagnosis not present

## 2021-12-25 DIAGNOSIS — Z23 Encounter for immunization: Secondary | ICD-10-CM | POA: Diagnosis not present

## 2021-12-25 DIAGNOSIS — Z0000 Encounter for general adult medical examination without abnormal findings: Secondary | ICD-10-CM

## 2021-12-25 MED ORDER — AMLODIPINE BESYLATE 5 MG PO TABS
5.0000 mg | ORAL_TABLET | Freq: Every day | ORAL | 11 refills | Status: DC
  Filled 2021-12-25: qty 30, 30d supply, fill #0
  Filled 2022-02-04: qty 30, 30d supply, fill #1

## 2021-12-25 MED ORDER — ALLOPURINOL 100 MG PO TABS
200.0000 mg | ORAL_TABLET | Freq: Every day | ORAL | 2 refills | Status: DC
  Filled 2021-12-25: qty 60, 30d supply, fill #0

## 2021-12-25 MED ORDER — ATORVASTATIN CALCIUM 20 MG PO TABS
20.0000 mg | ORAL_TABLET | Freq: Every day | ORAL | 3 refills | Status: DC
  Filled 2021-12-25: qty 90, 90d supply, fill #0
  Filled 2022-04-04: qty 90, 90d supply, fill #1
  Filled 2022-07-21 – 2022-08-11 (×3): qty 90, 90d supply, fill #2
  Filled 2022-11-27: qty 90, 90d supply, fill #3

## 2021-12-25 MED ORDER — COLCHICINE 0.6 MG PO TABS
0.6000 mg | ORAL_TABLET | Freq: Every day | ORAL | 0 refills | Status: DC
  Filled 2021-12-25: qty 30, 30d supply, fill #0

## 2021-12-25 NOTE — Patient Instructions (Addendum)
[name], it was a pleasure seeing you today! You endorsed feeling well today. Below are some of the things we talked about this visit. We look forward to seeing you in the follow up appointment!  Today we discussed: We refilled your medicines today. We are changing your blood pressure medicines. Please stop the HCTZ and start amlodipine. Continue with olmesartan. Please come back in 3 weeks.   I have ordered the following labs today:   Lab Orders         BMP8+Anion Gap         Lipid Profile       Referrals ordered today:   Referral Orders  No referral(s) requested today     I have ordered the following medication/changed the following medications:   Stop the following medications: Medications Discontinued During This Encounter  Medication Reason   predniSONE (DELTASONE) 20 MG tablet Patient has not taken in last 30 days   indomethacin (INDOCIN) 50 MG capsule Patient has not taken in last 30 days   atorvastatin (LIPITOR) 20 MG tablet Reorder   colchicine 0.6 MG tablet Reorder   allopurinol (ZYLOPRIM) 100 MG tablet Reorder   hydrochlorothiazide (HYDRODIURIL) 25 MG tablet Duplicate     Start the following medications: Meds ordered this encounter  Medications   allopurinol (ZYLOPRIM) 100 MG tablet    Sig: Take 2 tablets (200 mg total) by mouth daily.    Dispense:  60 tablet    Refill:  2   colchicine 0.6 MG tablet    Sig: Take 1 tablet (0.6 mg total) by mouth daily. Only take during gout flare.    Dispense:  30 tablet    Refill:  0   atorvastatin (LIPITOR) 20 MG tablet    Sig: Take 1 tablet (20 mg total) by mouth daily.    Dispense:  90 tablet    Refill:  3   amLODipine (NORVASC) 5 MG tablet    Sig: Take 1 tablet (5 mg total) by mouth daily.    Dispense:  30 tablet    Refill:  11     Follow-up:  3 week follow up  Please make sure to arrive 15 minutes prior to your next appointment. If you arrive late, you may be asked to reschedule.   We look forward to  seeing you next time. Please call our clinic at [phone] if you have any questions or concerns. The best time to call is Monday-Friday from 9am-4pm, but there is someone available 24/7. If after hours or the weekend, call the main hospital number and ask for the Internal Medicine Resident On-Call. If you need medication refills, please notify your pharmacy one week in advance and they will send [place] a request.  Thank you for letting us take part in your care. Wishing you the best!  Thank you, Gwenevere Abbot, MD

## 2021-12-26 LAB — LIPID PANEL
Chol/HDL Ratio: 3.2 ratio (ref 0.0–5.0)
Cholesterol, Total: 212 mg/dL — ABNORMAL HIGH (ref 100–199)
HDL: 66 mg/dL (ref >39)
LDL Chol Calc (NIH): 117 mg/dL — ABNORMAL HIGH (ref 0–99)
Triglycerides: 167 mg/dL — ABNORMAL HIGH (ref 0–149)
VLDL Cholesterol Cal: 29 mg/dL (ref 5–40)

## 2021-12-26 LAB — BMP8+ANION GAP
Anion Gap: 14 mmol/L (ref 10.0–18.0)
BUN/Creatinine Ratio: 16 (ref 9–20)
BUN: 25 mg/dL — ABNORMAL HIGH (ref 6–24)
CO2: 23 mmol/L (ref 20–29)
Calcium: 9.3 mg/dL (ref 8.7–10.2)
Chloride: 100 mmol/L (ref 96–106)
Creatinine, Ser: 1.53 mg/dL — ABNORMAL HIGH (ref 0.76–1.27)
Glucose: 78 mg/dL (ref 70–99)
Potassium: 5.2 mmol/L (ref 3.5–5.2)
Sodium: 137 mmol/L (ref 134–144)
eGFR: 52 mL/min/{1.73_m2} — ABNORMAL LOW (ref >59)

## 2021-12-27 NOTE — Assessment & Plan Note (Signed)
Patient has HLD with goal of primary prevention. Last lipid panel on 03/2021 showed Chol 228, LDL 119. Reports non-adherence to lipitor 20 mg without adverse effects. Last refilled on 01/25/20. ASCVD risk 14.2. Counseled on the importance of continued lifestyle modifications, including: weight loss, daily exercise, and healthy diet with limited processed and fatty foods.  -Lipid panel ordered; Chol 212 and LDL 117. ASCVD Risk unchanged at 14 %. Since patient has not been complaint with lipitor 20 mg, will continue this medication. Will repeat lipid panel in 3-6 months and uptitrate statin if no improvement.  -Encouraged to continue lifestyle modifications

## 2021-12-27 NOTE — Assessment & Plan Note (Addendum)
BP at not at goal. BP in clinic today 142/89, HR 85. Does not monitor BP level at home; educated on the importance of it. Home medications include Olmesartan 20 mg and HCTZ 25 mg qd. Reports good medication compliance. Denies headaches, vision changes, lightheadedness, chest pain, SHOB, leg swelling or changes in speech. Last creatinine was elevated at 1.41 in 03/2021. Counseled on the importance of medication adherence, daily exercise, low salt diet, and weight loss. -Continue Olmesartan 20 mg qd and start amlodipine 5 mg and stop HCTZ 25 mg given gout.  -BP log and bring to next visit  -Continue lifestyle changes -F/u on BMP; BMP shows normal electrolytes but elevated creatinine. Unsure if this is AKI vs CKD but could be CKD given previous elevated levels. Last lab work was consistent with CKDIIIa and this one is as well. Called and encouraged pt to hydrate as we will repeat blood work in 3 weeks.  -Plan to repeat ACR during the next visit.

## 2021-12-27 NOTE — Assessment & Plan Note (Signed)
Flu shot given this visit. 

## 2021-12-27 NOTE — Progress Notes (Signed)
Internal Medicine Clinic Attending  Case discussed with Dr. Khan  At the time of the visit.  We reviewed the resident's history and exam and pertinent patient test results.  I agree with the assessment, diagnosis, and plan of care documented in the resident's note.  

## 2021-12-27 NOTE — Assessment & Plan Note (Addendum)
On Allupurinol 200 mg qd. Last filled on 08/26/21. Uric acid level was 11.6 03/2021. Will restart allupurinol 100 mg qd and repeat uric acid level in 3 weeks to see if uric acid is within therapeutic range. Will start low and titrate slow given some kidney impairment.

## 2021-12-30 ENCOUNTER — Encounter: Payer: Self-pay | Admitting: Internal Medicine

## 2022-01-14 ENCOUNTER — Encounter: Payer: Self-pay | Admitting: Family Medicine

## 2022-01-14 ENCOUNTER — Ambulatory Visit (INDEPENDENT_AMBULATORY_CARE_PROVIDER_SITE_OTHER): Payer: BC Managed Care – PPO | Admitting: Family Medicine

## 2022-01-14 DIAGNOSIS — E785 Hyperlipidemia, unspecified: Secondary | ICD-10-CM | POA: Diagnosis not present

## 2022-01-14 DIAGNOSIS — I10 Essential (primary) hypertension: Secondary | ICD-10-CM | POA: Diagnosis not present

## 2022-01-14 NOTE — Assessment & Plan Note (Signed)
Patient's blood pressure was not at goal on his previous visit on September 13.  Patient was on olmesartan 80 mg and hydrochlorothiazide 85 mg.  Hydrochlorothiazide was stopped and patient was started on amlodipine 5 mg.  Patient was educated to keep the blood pressure log and return to clinic with the blood pressure log to help evaluate the efficacy of the medications.  Patient reports he experienced headache when he started amlodipine which lasted for few days and resolved.  Currently he does not experience any side effects from the medication change.  He denies headache, dizziness, pedal edema or hypotensive episodes.  Patient did not monitor blood pressure at home.  He only took 1 reading last night which was 161/83.  Detailed education and the importance of keeping the blood pressure log and taking the prescribed medications every day.  Patient agreed to keep the blood pressure log until his follow-up appointment in 2 weeks.  He will continue the olmesartan and amlodipine as prescribed until his follow-up appointment.

## 2022-01-14 NOTE — Assessment & Plan Note (Addendum)
Lipid profile on December 2022:  LDL of 119 and repeat lipid profile on 9/13: LDL of 117.  Patient was not compliant with taking the medication.  Patient was educated importance of taking the medication on regular basis which will lower the levels of LDL and decrease the cardiovascular risk.  Patient was also educated to participate in brisk exercise, weight loss and diet low in carbs. Patient agreed to take the medication every day.  We will keep the patient on the same dosage of atorvastatin 20 mg. Repeat the LDL in 3 to 6 months.

## 2022-01-14 NOTE — Progress Notes (Signed)
  [facility] Telephone Encounter Continuity Care Appointment  HPI:  This telephone encounter was created for [name] on 01/14/2022 for the following purpose/cc hypertension follow-up. Patient's blood pressure was not at goal on previous the visit on September 13.  Hydrochlorothiazide 25 mg was stopped and amlodipine 5 mg was added to olmesartan 20 mg.  Patient is taking both antihypertensive medications as prescribed.  Patient experienced headache for few days after he started amlodipine however headache gradually resolved after few days.  Patient is tolerating medication well without adverse effects.    Past Medical History:  Past Medical History:  Diagnosis Date   Foot pain, bilateral 07/19/2020   Gout    Hyperlipidemia    Hypertension      ROS:  Pt comfortably communicated with compete sentences.  Denies physical complaints   Assessment / Plan / Recommendations:  Please see A&P under problem oriented charting for assessment of the patient's acute and chronic medical conditions.  As always, pt is advised that if symptoms worsen or new symptoms arise, they should go to an urgent care facility or to to ER for further evaluation.   Consent and Medical Decision Making:  Patient seen with Dr. Daryll Drown This is a telephone encounter between Eliezer Champagne and Bona Hubbard on 01/14/2022 for HTN. The visit was conducted with the patient located at home and Lanier Felty at [facility]. The patient's identity was confirmed using their DOB and current address. The patient has consented to being evaluated through a telephone encounter and understands the associated risks (an examination cannot be done and the patient may need to come in for an appointment) / benefits (allows the patient to remain at home, decreasing exposure to coronavirus). I personally spent 25 minutes on medical discussion.

## 2022-01-14 NOTE — Patient Instructions (Signed)
You will continue amlodipine and olmesartan.  No changes were made to your medications today. You will keep the blood pressure log for 2 weeks. Follow-up in the clinic in 2 weeks with the blood pressure log to determine if the change in medication is keeping his blood pressure at goal.

## 2022-01-24 NOTE — Progress Notes (Signed)
Internal Medicine Clinic Attending  Case discussed with Dr. Multani  at the time of the visit.  We reviewed the resident's history and pertinent patient test results.  I agree with the assessment, diagnosis, and plan of care documented in the resident's note.  

## 2022-02-04 ENCOUNTER — Ambulatory Visit (INDEPENDENT_AMBULATORY_CARE_PROVIDER_SITE_OTHER): Payer: BC Managed Care – PPO

## 2022-02-04 ENCOUNTER — Other Ambulatory Visit (HOSPITAL_COMMUNITY): Payer: Self-pay

## 2022-02-04 DIAGNOSIS — M1A00X1 Idiopathic chronic gout, unspecified site, with tophus (tophi): Secondary | ICD-10-CM

## 2022-02-04 DIAGNOSIS — I10 Essential (primary) hypertension: Secondary | ICD-10-CM | POA: Diagnosis not present

## 2022-02-04 DIAGNOSIS — E785 Hyperlipidemia, unspecified: Secondary | ICD-10-CM

## 2022-02-04 NOTE — Assessment & Plan Note (Signed)
ASCVD is 12.9% and most recent LDL was 117 in 12/2021. He says at that time he had not taken his atorvastatin in 1-2 months. Expect his LDL to come down 30% with resuming his statin and consistently taking it. Will repeat lipid panel at next visit. Counseled patient on importance of adherence. -repeat lipid panel at next visit -primary prevention goal <100 -increase atorvastatin dose if not at goal

## 2022-02-04 NOTE — Progress Notes (Signed)
   Established Patient Office Visit  Subjective   Patient ID: Tyler Burch, male    DOB: [birth year]  Age: 61 y.o. MRN: [id number]  Chief Complaint  Patient presents with   Hypertension   Medication Refill    amLODipine [facility]  [address], [place] [address]       Mr. Zirbes is a 61 y/o male with a pmh outlined below. Please see encounter tab for HPI and A/P information.  Hypertension  Medication Refill      Review of Systems  All other systems reviewed and are negative.     Objective:     BP 133/89 (BP Location: Left Arm, Patient Position: Sitting, Cuff Size: Normal)   Pulse 93   Ht 6' (1.829 m)   Wt 205 lb 6.4 oz (93.2 kg)   SpO2 100%   BMI 27.86 kg/m    Physical Exam Constitutional:      General: He is not in acute distress.    Appearance: Normal appearance. He is not ill-appearing.  Cardiovascular:     Rate and Rhythm: Normal rate and regular rhythm.     Pulses: Normal pulses.     Heart sounds: Normal heart sounds. No murmur heard.    No friction rub. No gallop.  Pulmonary:     Effort: Respiratory distress present.     Breath sounds: Normal breath sounds. No wheezing or rales.  Musculoskeletal:     Right lower leg: No edema.     Left lower leg: No edema.  Skin:    General: Skin is warm and dry.     Capillary Refill: Capillary refill takes less than 2 seconds.  Neurological:     Mental Status: He is alert.      No results found for any visits on 02/04/22.    The 10-year ASCVD risk score (Arnett DK, et al., 2019) is: 13%    Assessment & Plan:   Problem List Items Addressed This Visit       Cardiovascular and Mediastinum   Essential hypertension    Recently stopped HCTZ and started amlodipine 5 with continuation of olmesartan. Patient says highest systolic numbers are around 132 and his BP in clinic today is 133/89. He is not having any headache, CP, SOB. Will follow up in 3 months and if  everything is okay can start seeing him at 6 month intervals. -olmesartan 80 mg -amlodipine 5 mg         Other   Gout    Not having any gout attacks and is stable on allopurinol 100mg  daily. Given that HCTZ was recently stopped and he is attack free will not measure uric acid level and will continue current dose.      Hyperlipidemia    ASCVD is 12.9% and most recent LDL was 117 in 12/2021. He says at that time he had not taken his atorvastatin in 1-2 months. Expect his LDL to come down 30% with resuming his statin and consistently taking it. Will repeat lipid panel at next visit. Counseled patient on importance of adherence. -repeat lipid panel at next visit -primary prevention goal <100 -increase atorvastatin dose if not at goal       Return in about 3 months (around 05/07/2022).    Iona Coach, MD

## 2022-02-04 NOTE — Assessment & Plan Note (Signed)
Not having any gout attacks and is stable on allopurinol 100mg  daily. Given that HCTZ was recently stopped and he is attack free will not measure uric acid level and will continue current dose.

## 2022-02-04 NOTE — Assessment & Plan Note (Signed)
Recently stopped HCTZ and started amlodipine 5 with continuation of olmesartan. Patient says highest systolic numbers are around 132 and his BP in clinic today is 133/89. He is not having any headache, CP, SOB. Will follow up in 3 months and if everything is okay can start seeing him at 6 month intervals. -olmesartan 80 mg -amlodipine 5 mg

## 2022-02-04 NOTE — Progress Notes (Deleted)
HTN Recently stopped HCTZ and started amlodipine 5 -olmesartan 80 mg -2 week BP follow up  CKD 2 Cr 1.41 to 1.53 1 month ago with egfr of 52  Gout On allopurinol 2105m qd. Restarted on 1067mqd. Supposed to have repeat uric acid level. Increase in 50 mg doses due to renal impairment. Goal urate <6 as soluble limit at 6.8 -uric acid level  HLD LDL 117 12/2021 Atorvastatin 2026mSCVD 12.9%  Care gaps zoster

## 2022-02-04 NOTE — Patient Instructions (Signed)
Thank you, Mr.Celedonio Casanova for allowing [place] to provide your care today. Today we discussed : High blood pressure- Your blood pressure was 133/89 today which is excellent! Continue taking your medications and keeping up the good work. Controlling blood pressure is very important for preventing stroke, heart attack, and kidney disease.   Referrals ordered today:   Referral Orders  No referral(s) requested today     I have ordered the following medication/changed the following medications:   Stop the following medications: There are no discontinued medications.   Start the following medications: No orders of the defined types were placed in this encounter.    Follow up: 3 months     We look forward to seeing you next time. Please call our clinic at [phone] if you have any questions or concerns. The best time to call is Monday-Friday from 9am-4pm, but there is someone available 24/7. If after hours or the weekend, call the main hospital number and ask for the Internal Medicine Resident On-Call. If you need medication refills, please notify your pharmacy one week in advance and they will send [place] a request.   Thank you for trusting me with your care. Wishing you the best!   Iona Coach, MD [place]

## 2022-02-14 ENCOUNTER — Other Ambulatory Visit (HOSPITAL_COMMUNITY): Payer: Self-pay

## 2022-02-24 NOTE — Progress Notes (Signed)
Internal Medicine Clinic Attending  I saw and evaluated the patient.  I personally confirmed the key portions of the history and exam documented by Dr. Rogers and I reviewed pertinent patient test results.  The assessment, diagnosis, and plan were formulated together and I agree with the documentation in the resident's note.  

## 2022-04-04 ENCOUNTER — Other Ambulatory Visit (HOSPITAL_COMMUNITY): Payer: Self-pay

## 2022-04-04 ENCOUNTER — Other Ambulatory Visit: Payer: Self-pay | Admitting: Internal Medicine

## 2022-04-04 DIAGNOSIS — I10 Essential (primary) hypertension: Secondary | ICD-10-CM

## 2022-04-10 ENCOUNTER — Other Ambulatory Visit (HOSPITAL_COMMUNITY): Payer: Self-pay

## 2022-04-10 MED ORDER — OLMESARTAN MEDOXOMIL 20 MG PO TABS
20.0000 mg | ORAL_TABLET | Freq: Every day | ORAL | 2 refills | Status: DC
  Filled 2022-04-10: qty 90, 90d supply, fill #0
  Filled 2022-07-21 – 2022-08-11 (×3): qty 90, 90d supply, fill #1
  Filled 2022-11-27: qty 90, 90d supply, fill #2

## 2022-04-17 ENCOUNTER — Other Ambulatory Visit (HOSPITAL_COMMUNITY): Payer: Self-pay

## 2022-05-07 ENCOUNTER — Encounter: Payer: BC Managed Care – PPO | Admitting: Student

## 2022-07-06 ENCOUNTER — Emergency Department (HOSPITAL_BASED_OUTPATIENT_CLINIC_OR_DEPARTMENT_OTHER): Payer: BC Managed Care – PPO

## 2022-07-06 ENCOUNTER — Emergency Department (HOSPITAL_BASED_OUTPATIENT_CLINIC_OR_DEPARTMENT_OTHER)
Admission: EM | Admit: 2022-07-06 | Discharge: 2022-07-06 | Disposition: A | Discharge status: Home / Self Care | Payer: BC Managed Care – PPO | Attending: Emergency Medicine | Admitting: Emergency Medicine

## 2022-07-06 ENCOUNTER — Other Ambulatory Visit: Payer: Self-pay

## 2022-07-06 DIAGNOSIS — Z23 Encounter for immunization: Secondary | ICD-10-CM | POA: Insufficient documentation

## 2022-07-06 DIAGNOSIS — W25XXXA Contact with sharp glass, initial encounter: Secondary | ICD-10-CM | POA: Diagnosis not present

## 2022-07-06 DIAGNOSIS — S60511A Abrasion of right hand, initial encounter: Secondary | ICD-10-CM | POA: Diagnosis not present

## 2022-07-06 MED ORDER — OXYCODONE-ACETAMINOPHEN 5-325 MG PO TABS
1.0000 | ORAL_TABLET | Freq: Once | ORAL | Status: AC
  Administered 2022-07-06: 1 via ORAL
  Filled 2022-07-06: qty 1

## 2022-07-06 MED ORDER — TETANUS-DIPHTH-ACELL PERTUSSIS 5-2.5-18.5 LF-MCG/0.5 IM SUSY
0.5000 mL | PREFILLED_SYRINGE | Freq: Once | INTRAMUSCULAR | Status: AC
  Administered 2022-07-06: 0.5 mL via INTRAMUSCULAR
  Filled 2022-07-06: qty 0.5

## 2022-07-06 NOTE — Discharge Instructions (Signed)
Thank you for letting us take care of you today.  Your x-ray was negative.  After your evaluation, I do not think we need to place any stitches to your wounds today.  It is very important though to keep them clean and dry as we discussed.  We did update your tetanus vaccination today and gave you a dose of pain medication while in the emergency department.  You may take over-the-counter Tylenol and ibuprofen at home as needed for pain.  Please avoid any activities that may introduce dirt or other contaminants into the wound that could lead to potential infection.  If you notice any significant redness, swelling, or puslike drainage from your wounds or other symptoms such as fever or worsening pain, please be reevaluated in the nearest emergency department.

## 2022-07-06 NOTE — ED Provider Notes (Signed)
[facility] Provider Note   CSN: [id number] Arrival date & time: 07/06/22  [id number]     History {Add pertinent medical, surgical, social history, OB history to HPI:1} Chief Complaint  Patient presents with   Hand Injury    Tyler Burch is a 62 y.o. male.  HPI     Home Medications Prior to Admission medications   Medication Sig Start Date End Date Taking? Authorizing Provider  allopurinol (ZYLOPRIM) 100 MG tablet Take 2 tablets (200 mg total) by mouth daily. 12/25/21   Idamae Schuller, MD  amLODipine (NORVASC) 5 MG tablet Take 1 tablet (5 mg total) by mouth daily. 12/25/21 12/25/22  Idamae Schuller, MD  atorvastatin (LIPITOR) 20 MG tablet Take 1 tablet (20 mg total) by mouth daily. 12/25/21 12/25/22  Idamae Schuller, MD  colchicine 0.6 MG tablet Take 1 tablet (0.6 mg total) by mouth daily. Only take during gout flare. 12/25/21 01/24/22  Idamae Schuller, MD  olmesartan (BENICAR) 20 MG tablet Take 1 tablet (20 mg total) by mouth daily. 04/10/22   Lucious Groves, DO  sildenafil (VIAGRA) 50 MG tablet Take 1 tablet (50 mg total) by mouth as needed for erectile dysfunction. 07/07/19 07/06/20  Marianna Payment, MD  triamcinolone ointment (KENALOG) 0.1 % Apply topically 2 (two) times daily. 08/23/21   Marianna Payment, MD  white petrolatum (VASELINE) OINT Apply 1 application topically as needed for lip care. 07/20/19   Maudie Mercury, MD      Allergies    Shellfish allergy    Review of Systems   Review of Systems  Physical Exam Updated Vital Signs BP (!) 163/110   Pulse 79   Temp 98.2 F (36.8 C)   Resp 18   Ht 6' (1.829 m)   Wt 100.7 kg   SpO2 99%   BMI 30.11 kg/m  Physical Exam Vitals and nursing note reviewed.  Constitutional:      General: He is not in acute distress.    Appearance: Normal appearance.  HENT:     Head: Normocephalic and atraumatic.     Mouth/Throat:     Mouth: Mucous membranes are moist.  Eyes:     Conjunctiva/sclera: Conjunctivae  normal.  Cardiovascular:     Rate and Rhythm: Normal rate and regular rhythm.     Heart sounds: No murmur heard. Pulmonary:     Effort: Pulmonary effort is normal.     Breath sounds: Normal breath sounds.  Abdominal:     General: Abdomen is flat.     Palpations: Abdomen is soft.     Tenderness: There is no abdominal tenderness.  Musculoskeletal:        General: Normal range of motion.     Cervical back: Neck supple.     Right lower leg: No edema.     Left lower leg: No edema.     Comments: Multiple superficial linear abrasions over palmar aspect of right fifth digit and hand  Skin:    General: Skin is warm and dry.     Capillary Refill: Capillary refill takes less than 2 seconds.  Neurological:     Mental Status: He is alert. Mental status is at baseline.  Psychiatric:        Behavior: Behavior normal.     ED Results / Procedures / Treatments   Labs (all labs ordered are listed, but only abnormal results are displayed) Labs Reviewed - No data to display  EKG None  Radiology DG Hand Complete Left  Result Date: 07/06/2022 CLINICAL DATA:  Trauma EXAM: LEFT HAND - COMPLETE 3+ VIEW COMPARISON:  None Available. FINDINGS: No evidence of fracture, dislocation, or joint effusion. No evidence of severe arthropathy. No aggressive appearing focal bone abnormality. Soft tissues are unremarkable. IMPRESSION: No acute displaced fracture or dislocation. Electronically Signed   By: Iven Finn M.D.   On: 07/06/2022 20:37    Procedures Procedures  {Document cardiac monitor, telemetry assessment procedure when appropriate:1}  Medications Ordered in ED Medications  Tdap (BOOSTRIX) injection 0.5 mL (has no administration in time range)  oxyCODONE-acetaminophen (PERCOCET/ROXICET) 5-325 MG per tablet 1 tablet (has no administration in time range)    ED Course/ Medical Decision Making/ A&P   {   Click here for ABCD2, HEART and other calculatorsREFRESH Note before signing :1}                           Medical Decision Making Amount and/or Complexity of Data Reviewed Radiology: ordered.  Risk Prescription drug management.   ***  {Document critical care time when appropriate:1} {Document review of labs and clinical decision tools ie heart score, Chads2Vasc2 etc:1}  {Document your independent review of radiology images, and any outside records:1} {Document your discussion with family members, caretakers, and with consultants:1} {Document social determinants of health affecting pt's care:1} {Document your decision making why or why not admission, treatments were needed:1} Final Clinical Impression(s) / ED Diagnoses Final diagnoses:  None    Rx / DC Orders ED Discharge Orders     None

## 2022-07-06 NOTE — ED Triage Notes (Signed)
Reports he was digging in the trash and a piece of glass cut him. Lacerations noted to L hand. Bleeding controlled at time of triage.

## 2022-07-21 ENCOUNTER — Other Ambulatory Visit (HOSPITAL_COMMUNITY): Payer: Self-pay

## 2022-07-22 ENCOUNTER — Other Ambulatory Visit (HOSPITAL_COMMUNITY): Payer: Self-pay

## 2022-08-07 ENCOUNTER — Other Ambulatory Visit (HOSPITAL_COMMUNITY): Payer: Self-pay

## 2022-08-11 ENCOUNTER — Other Ambulatory Visit (HOSPITAL_COMMUNITY): Payer: Self-pay

## 2022-11-27 ENCOUNTER — Other Ambulatory Visit (HOSPITAL_COMMUNITY): Payer: Self-pay

## 2022-12-01 ENCOUNTER — Other Ambulatory Visit (HOSPITAL_COMMUNITY): Payer: Self-pay

## 2023-02-10 ENCOUNTER — Other Ambulatory Visit (HOSPITAL_COMMUNITY): Payer: Self-pay

## 2023-02-10 MED ORDER — CLENPIQ 10-3.5-12 MG-GM -GM/175ML PO SOLN
ORAL | 0 refills | Status: AC
  Filled 2023-02-10: qty 350, 2d supply, fill #0
  Filled 2023-02-23: qty 350, 1d supply, fill #0

## 2023-02-20 ENCOUNTER — Other Ambulatory Visit (HOSPITAL_COMMUNITY): Payer: Self-pay

## 2023-02-23 ENCOUNTER — Other Ambulatory Visit (HOSPITAL_COMMUNITY): Payer: Self-pay

## 2023-03-03 ENCOUNTER — Other Ambulatory Visit (HOSPITAL_COMMUNITY): Payer: Self-pay

## 2023-03-03 ENCOUNTER — Other Ambulatory Visit: Payer: Self-pay | Admitting: Internal Medicine

## 2023-03-03 ENCOUNTER — Other Ambulatory Visit: Payer: Self-pay

## 2023-03-03 DIAGNOSIS — I10 Essential (primary) hypertension: Secondary | ICD-10-CM

## 2023-03-03 DIAGNOSIS — E785 Hyperlipidemia, unspecified: Secondary | ICD-10-CM

## 2023-03-03 MED ORDER — ATORVASTATIN CALCIUM 20 MG PO TABS
20.0000 mg | ORAL_TABLET | Freq: Every day | ORAL | 3 refills | Status: DC
  Filled 2023-03-03: qty 90, 90d supply, fill #0

## 2023-03-03 MED ORDER — OLMESARTAN MEDOXOMIL 20 MG PO TABS
20.0000 mg | ORAL_TABLET | Freq: Every day | ORAL | 0 refills | Status: DC
  Filled 2023-03-03: qty 30, 30d supply, fill #0

## 2023-03-04 ENCOUNTER — Other Ambulatory Visit (HOSPITAL_COMMUNITY): Payer: Self-pay

## 2023-03-05 ENCOUNTER — Encounter: Payer: BC Managed Care – PPO | Admitting: Student

## 2023-03-05 ENCOUNTER — Other Ambulatory Visit: Payer: Self-pay

## 2023-03-05 ENCOUNTER — Other Ambulatory Visit (HOSPITAL_COMMUNITY): Payer: Self-pay

## 2023-03-05 DIAGNOSIS — I10 Essential (primary) hypertension: Secondary | ICD-10-CM

## 2023-03-05 MED ORDER — AMLODIPINE BESYLATE 5 MG PO TABS
5.0000 mg | ORAL_TABLET | Freq: Every day | ORAL | 11 refills | Status: DC
  Filled 2023-03-05: qty 30, 30d supply, fill #0

## 2023-03-05 NOTE — Telephone Encounter (Signed)
Medication sent to pharmacy  

## 2023-03-24 ENCOUNTER — Encounter: Payer: BC Managed Care – PPO | Admitting: Internal Medicine

## 2023-03-24 NOTE — Progress Notes (Deleted)
CC: HTN  HPI:  [name] is a 62 y.o. male living with a history stated below and presents today for a follow up of his HTN and chronic medical conditions. Please see problem based assessment and plan for additional details.  Past Medical History:  Diagnosis Date   Foot pain, bilateral 07/19/2020   Gout    Hyperlipidemia    Hypertension     Current Outpatient Medications on File Prior to Visit  Medication Sig Dispense Refill   allopurinol (ZYLOPRIM) 100 MG tablet Take 2 tablets (200 mg total) by mouth daily. 60 tablet 2   amLODipine (NORVASC) 5 MG tablet Take 1 tablet (5 mg total) by mouth daily. 30 tablet 11   atorvastatin (LIPITOR) 20 MG tablet Take 1 tablet (20 mg total) by mouth daily. 90 tablet 3   colchicine 0.6 MG tablet Take 1 tablet (0.6 mg total) by mouth daily. Only take during gout flare. 30 tablet 0   olmesartan (BENICAR) 20 MG tablet Take 1 tablet (20 mg total) by mouth daily. 30 tablet 0   sildenafil (VIAGRA) 50 MG tablet Take 1 tablet (50 mg total) by mouth as needed for erectile dysfunction. 5 tablet 1   Sod Picosulfate-Mag Ox-Cit Acd (CLENPIQ) 10-3.5-12 MG-GM -GM/175ML SOLN Use as directed per office instructions and not the instructions on the packaging. Do not refrigerate. 350 mL 0   triamcinolone ointment (KENALOG) 0.1 % Apply topically 2 (two) times daily. 454 g 0   white petrolatum (VASELINE) OINT Apply 1 application topically as needed for lip care. 1000 g 0   No current facility-administered medications on file prior to visit.    Family History  Problem Relation Age of Onset   Colon cancer Sister     Social History   Socioeconomic History   Marital status: Married    Spouse name: Not on file   Number of children: Not on file   Years of education: Not on file   Highest education level: Not on file  Occupational History   Occupation: Forklift  Tobacco Use   Smoking status: Never   Smokeless tobacco: Never  Substance and Sexual Activity    Alcohol use: Yes    Alcohol/week: 0.0 standard drinks of alcohol    Comment: Sometimes.   Drug use: No   Sexual activity: Not on file  Other Topics Concern   Not on file  Social History Narrative   Not on file   Social Determinants of Health   Financial Resource Strain: Not on file  Food Insecurity: Not on file  Transportation Needs: Not on file  Physical Activity: Not on file  Stress: Not on file  Social Connections: Not on file  Intimate Partner Violence: Not on file    Review of Systems: ROS negative except for what is noted on the assessment and plan.  There were no vitals filed for this visit.  Physical Exam: Constitutional: well-appearing *** sitting in ***, in no acute distress HENT: normocephalic atraumatic, mucous membranes moist Eyes: conjunctiva non-erythematous Cardiovascular: regular rate and rhythm, no m/r/g Pulmonary/Chest: normal work of breathing on room air, lungs clear to auscultation bilaterally Abdominal: soft, non-tender, non-distended MSK: normal bulk and tone Neurological: alert & oriented x 3, no focal deficit Skin: warm and dry Psych: normal mood and behavior  Assessment & Plan:   HTN: - amlodipine 5 mg daily - olmesartan 80 mg daily  HLD: - repeat lipid panel - atorva 20  Gout: - taking colchicine 0.6 mg daily? Allopurinol  200?   HCM: - flu shot   Patient {GC/GE:[id number]::"discussed with","seen with"} Dr. {[id number]::"WGNFAOZH","Y. Hoffman","Mullen","Narendra","Vincent","Guilloud","Lau","Machen"}  No problem-specific Assessment & Plan notes found for this encounter.   Tyler Burch, D.O. [facility] Internal Medicine, PGY-3 Phone: [phone] Date 03/24/2023 Time 7:06 AM

## 2023-03-25 ENCOUNTER — Ambulatory Visit (INDEPENDENT_AMBULATORY_CARE_PROVIDER_SITE_OTHER): Payer: BC Managed Care – PPO | Admitting: Student

## 2023-03-25 ENCOUNTER — Encounter: Payer: Self-pay | Admitting: Student

## 2023-03-25 VITALS — BP 149/91 | HR 84 | Wt 194.6 lb

## 2023-03-25 DIAGNOSIS — R7303 Prediabetes: Secondary | ICD-10-CM | POA: Diagnosis not present

## 2023-03-25 DIAGNOSIS — I129 Hypertensive chronic kidney disease with stage 1 through stage 4 chronic kidney disease, or unspecified chronic kidney disease: Secondary | ICD-10-CM

## 2023-03-25 DIAGNOSIS — R109 Unspecified abdominal pain: Secondary | ICD-10-CM

## 2023-03-25 DIAGNOSIS — N182 Chronic kidney disease, stage 2 (mild): Secondary | ICD-10-CM

## 2023-03-25 DIAGNOSIS — F1090 Alcohol use, unspecified, uncomplicated: Secondary | ICD-10-CM

## 2023-03-25 DIAGNOSIS — I10 Essential (primary) hypertension: Secondary | ICD-10-CM | POA: Diagnosis not present

## 2023-03-25 DIAGNOSIS — E785 Hyperlipidemia, unspecified: Secondary | ICD-10-CM | POA: Diagnosis not present

## 2023-03-25 LAB — PROTIME-INR
INR: 1 (ref 0.8–1.2)
Prothrombin Time: 13.2 s (ref 11.4–15.2)

## 2023-03-25 NOTE — Patient Instructions (Addendum)
Thank you, Mr.Kass Hula for allowing [place] to provide your care today. Today we discussed   BLOOD PRESSURE Your blood pressure today is high.  I am going to do some blood work to monitor your kidney function and your electrolytes.  Based on this blood tests I will then change your blood pressure medications  BLOOD PRESSURE  Please check your blood pressure daily following the instructions below AND bring your readings to the next outpatient visit  Blood pressure tips   It is best to check your BP 1-2 hours after taking your medications to see the medications effectiveness on your BP.    Here are some tips that our clinical pharmacists share for home BP monitoring:          Rest 10 minutes before taking your blood pressure.          Don't smoke or drink caffeinated beverages for at least 30 minutes before.          Take your blood pressure before (not after) you eat.          Sit comfortably with your back supported and both feet on the floor (don't cross your legs).          Elevate your arm to heart level on a table or a desk.          Use the proper sized cuff. It should fit smoothly and snugly around your bare upper arm. There should be enough room to slip a fingertip under the cuff. The bottom edge of the cuff should be 1 inch above the crease of the elbow.     Take ALL your medications before you return to clinic   ALCOHOL USE DISORDER I would like to help you with decreasing or completely quitting the amount of alcohol that you are drinking every day to improve your overall health.  To start this, I will get some blood work to monitor your liver function, your blood counts for potential complications of alcohol use -Based on this laboratory studies I will go ahead and send in a medication to help you on your journey  FLANK PAIN I believe this flank pain is likely from your muscle tension that we discussed in clinic.  Continue with stretches as the pain is not long-lasting.  I  would definitely add Urine analysis we will check your urine for any blood as you have a history of kidney stones in the past.  If there is blood in the urine, I will go ahead and add more testing.   My Chart Access: [email or web address]  Please follow-up in: 1 month    We look forward to seeing you next time. Please call our clinic at [phone] if you have any questions or concerns. The best time to call is Monday-Friday from 9am-4pm, but there is someone available 24/7. If after hours or the weekend, call the main hospital number and ask for the Internal Medicine Resident On-Call. If you need medication refills, please notify your pharmacy one week in advance and they will send [place] a request.   Thank you for letting us take part in your care. Wishing you the best!  Morene Crocker, MD 03/25/2023, 9:45 AM [facility]

## 2023-03-25 NOTE — Assessment & Plan Note (Signed)
Recheck BMP today -Urine microalbumin rcreatinine ratio today

## 2023-03-25 NOTE — Progress Notes (Signed)
Subjective:  CC: follow up  HPI:  [name] is a 62 y.o. male with a past medical history stated below and presents today for hypertension and chronic medical condition follow up. Patient has not been seen in clinic for over a year. He also reports intermittent L sided flank pain. Please see problem based assessment and plan for additional details.  Past Medical History:  Diagnosis Date   Foot pain, bilateral 07/19/2020   Gout    Hyperlipidemia    Hypertension     Current Outpatient Medications on File Prior to Visit  Medication Sig Dispense Refill   allopurinol (ZYLOPRIM) 100 MG tablet Take 2 tablets (200 mg total) by mouth daily. 60 tablet 2   amLODipine (NORVASC) 5 MG tablet Take 1 tablet (5 mg total) by mouth daily. 30 tablet 11   atorvastatin (LIPITOR) 20 MG tablet Take 1 tablet (20 mg total) by mouth daily. 90 tablet 3   colchicine 0.6 MG tablet Take 1 tablet (0.6 mg total) by mouth daily. Only take during gout flare. 30 tablet 0   olmesartan (BENICAR) 20 MG tablet Take 1 tablet (20 mg total) by mouth daily. 30 tablet 0   sildenafil (VIAGRA) 50 MG tablet Take 1 tablet (50 mg total) by mouth as needed for erectile dysfunction. 5 tablet 1   Sod Picosulfate-Mag Ox-Cit Acd (CLENPIQ) 10-3.5-12 MG-GM -GM/175ML SOLN Use as directed per office instructions and not the instructions on the packaging. Do not refrigerate. 350 mL 0   triamcinolone ointment (KENALOG) 0.1 % Apply topically 2 (two) times daily. 454 g 0   white petrolatum (VASELINE) OINT Apply 1 application topically as needed for lip care. 1000 g 0   No current facility-administered medications on file prior to visit.    Family History  Problem Relation Age of Onset   Colon cancer Sister     Social History   Socioeconomic History   Marital status: Married    Spouse name: Not on file   Number of children: Not on file   Years of education: Not on file   Highest education level: Not on file  Occupational History    Occupation: Forklift  Tobacco Use   Smoking status: Never   Smokeless tobacco: Never  Substance and Sexual Activity   Alcohol use: Yes    Alcohol/week: 0.0 standard drinks of alcohol    Comment: Sometimes.   Drug use: No   Sexual activity: Not on file  Other Topics Concern   Not on file  Social History Narrative   Not on file   Social Determinants of Health   Financial Resource Strain: Not on file  Food Insecurity: No Food Insecurity (03/25/2023)   Hunger Vital Sign    Worried About Running Out of Food in the Last Year: Never true    Ran Out of Food in the Last Year: Never true  Transportation Needs: No Transportation Needs (03/25/2023)   PRAPARE - Administrator, Civil Service (Medical): No    Lack of Transportation (Non-Medical): No  Physical Activity: Not on file  Stress: Not on file  Social Connections: Not on file  Intimate Partner Violence: Not on file    Review of Systems: ROS negative except for what is noted on the assessment and plan.  Objective:   Vitals:   03/25/23 0918 03/25/23 0930  BP: (!) 148/94 (!) 149/91  Pulse: 89 84  SpO2: 98%   Weight: 194 lb 9.6 oz (88.3 kg)  Physical Exam: Constitutional: well-appearing manin no acute distress HENT: normocephalic atraumatic, mucous membranes moist Eyes: conjunctiva non-erythematous Neck: supple Cardiovascular: regular rate and rhythm, no m/r/g Pulmonary/Chest: normal work of breathing on room air, lungs clear to auscultation bilaterally Abdominal: soft, non-tender, non-distended. No suprapubic tenderness MSK: normal bulk and tone. Mild paraspinal tenderness. Full range of motion at the hip join. Negative  Neurological: alert & oriented x 3, 5/5 strength in bilateral upper and lower extremities, normal gait Skin: warm and dry Psych: Normal mood and affect       03/25/2023    9:21 AM  Depression screen PHQ 2/9  Decreased Interest 0  Down, Depressed, Hopeless 0  PHQ - 2 Score 0         No data to display           Assessment & Plan:   Essential hypertension Patient currently on the amlodipine and olmesartan 20 mg daily. Currently adherent to medications with appropriate medication dispense history. Currently asymptomatic. Patient has nothad labs checked in a year. Denies snoring. Will likely need a third antihypertensive- previously on hydrochlorothiazide but does not know why it was discontinued. - Check renal function and electrolytes today - Continue current therapy until blood work results; likely will start hydrochlorothiazide - BP log discussed; will return in 4 weeks for follow up  Pre-diabetes A1c today  Hyperlipidemia Currently on atorvastatin 20 mg daily for primary prevention.  -Lipid panel today  Alcohol use disorder In reviewing ROS for hypertension, patient mentioned that he has been drinking 5-6 beers daily for years. The longest period of time he has gone without drinking is a year after a hospitalization. He denies functional problems because of drinking but is understanding of the effect of it on his health and wishes to receive help. Discusses Naltrexone or acamprosate depending on hepatic enzymes and cirrhosis screening. - Will calculate Fib-4 with serum labs today - Depending on this, will send prescription for alcohol disorder treatment -Follow up in 4 weeks  Left flank pain Patient reported intermittent L flank pain felt after waking up from bed for the past 2 weeks that lasts about 10 minutes after getting up from bed.This improves with side body stretches and without topical or oral medications. He reports that he sleeps on the L side of his body. 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 Pain does not radiate. He denies dysuria, polyuria, suprapubic tenderness, changes in his urine color. He usually has two BM per day, formed and black. No radicular pain.  Physical exam  with mild musculoskeletal pain on palpation over lateral side of L flank. No CVA ot suprapubic tenderness. Negative straight leg raise. Reviewed patient past imaging studies and he has a history of L nephrolithiasis ~67mm in 2022.  Suspect this pain is musculoskeletal in nature and hissupportive treatment is working. However, given history of nephorlithisis on this side, will screen for microscopic hematuria to guide whether this person needs to be evaluated for symptomatic nephrolithiasis -Supportive treatment -U/A with microscopy  CKD (chronic kidney disease), stage II Recheck BMP today -Urine microalbumin rcreatinine ratio today    Return in about 4 weeks (around 04/22/2023) for HTN, CKD, and alcohol use disorder.  Patient discussed with Dr. Gardiner Ramus, MD [facility]  03/25/2023, 5:10 PM

## 2023-03-25 NOTE — Assessment & Plan Note (Signed)
A1c today.  

## 2023-03-25 NOTE — Assessment & Plan Note (Signed)
Currently on atorvastatin 20 mg daily for primary prevention.  -Lipid panel today

## 2023-03-25 NOTE — Assessment & Plan Note (Addendum)
Patient currently on the amlodipine and olmesartan 20 mg daily. Currently adherent to medications with appropriate medication dispense history. Currently asymptomatic. Patient has nothad labs checked in a year. Denies snoring. Will likely need a third antihypertensive- previously on hydrochlorothiazide but does not know why it was discontinued. - Check renal function and electrolytes today - Continue current therapy until blood work results; likely will start hydrochlorothiazide - BP log discussed; will return in 4 weeks for follow up

## 2023-03-25 NOTE — Assessment & Plan Note (Signed)
Patient reported intermittent L flank pain felt after waking up from bed for the past 2 weeks that lasts about 10 minutes after getting up from bed.This improves with side body stretches and without topical or oral medications. He reports that he sleeps on the L side of his body. 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 Pain does not radiate. He denies dysuria, polyuria, suprapubic tenderness, changes in his urine color. He usually has two BM per day, formed and black. No radicular pain.  Physical exam with mild musculoskeletal pain on palpation over lateral side of L flank. No CVA ot suprapubic tenderness. Negative straight leg raise. Reviewed patient past imaging studies and he has a history of L nephrolithiasis ~61mm in 2022.  Suspect this pain is musculoskeletal in nature and hissupportive treatment is working. However, given history of nephorlithisis on this side, will screen for microscopic hematuria to guide whether this person needs to be evaluated for symptomatic nephrolithiasis -Supportive treatment -U/A with microscopy

## 2023-03-25 NOTE — Assessment & Plan Note (Signed)
In reviewing ROS for hypertension, patient mentioned that he has been drinking 5-6 beers daily for years. The longest period of time he has gone without drinking is a year after a hospitalization. He denies functional problems because of drinking but is understanding of the effect of it on his health and wishes to receive help. Discusses Naltrexone or acamprosate depending on hepatic enzymes and cirrhosis screening. - Will calculate Fib-4 with serum labs today - Depending on this, will send prescription for alcohol disorder treatment -Follow up in 4 weeks

## 2023-03-26 LAB — URINALYSIS, ROUTINE W REFLEX MICROSCOPIC
Bilirubin, UA: NEGATIVE
Glucose, UA: NEGATIVE
Ketones, UA: NEGATIVE
Leukocytes,UA: NEGATIVE
Nitrite, UA: NEGATIVE
Protein,UA: NEGATIVE
RBC, UA: NEGATIVE
Specific Gravity, UA: 1.017 (ref 1.005–1.030)
Urobilinogen, Ur: 0.2 mg/dL (ref 0.2–1.0)
pH, UA: 5.5 (ref 5.0–7.5)

## 2023-03-26 LAB — MICROALBUMIN / CREATININE URINE RATIO
Creatinine, Urine: 76.6 mg/dL
Microalb/Creat Ratio: 49 mg/g{creat} — ABNORMAL HIGH (ref 0–29)
Microalbumin, Urine: 37.6 ug/mL

## 2023-03-27 LAB — CMP14 + ANION GAP
ALT: 28 [IU]/L (ref 0–44)
AST: 34 [IU]/L (ref 0–40)
Albumin: 4.3 g/dL (ref 3.8–4.9)
Alkaline Phosphatase: 77 [IU]/L (ref 44–121)
Anion Gap: 14 mmol/L (ref 10.0–18.0)
BUN/Creatinine Ratio: 25 — ABNORMAL HIGH (ref 10–24)
BUN: 34 mg/dL — ABNORMAL HIGH (ref 8–27)
Bilirubin Total: 0.5 mg/dL (ref 0.0–1.2)
CO2: 21 mmol/L (ref 20–29)
Calcium: 9.5 mg/dL (ref 8.6–10.2)
Chloride: 103 mmol/L (ref 96–106)
Creatinine, Ser: 1.37 mg/dL — ABNORMAL HIGH (ref 0.76–1.27)
Globulin, Total: 3 g/dL (ref 1.5–4.5)
Glucose: 83 mg/dL (ref 70–99)
Potassium: 5.2 mmol/L (ref 3.5–5.2)
Sodium: 138 mmol/L (ref 134–144)
Total Protein: 7.3 g/dL (ref 6.0–8.5)
eGFR: 59 mL/min/{1.73_m2} — ABNORMAL LOW (ref >59)

## 2023-03-27 LAB — LIPID PANEL
Chol/HDL Ratio: 2 {ratio} (ref 0.0–5.0)
Cholesterol, Total: 173 mg/dL (ref 100–199)
HDL: 87 mg/dL (ref >39)
LDL Chol Calc (NIH): 72 mg/dL (ref 0–99)
Triglycerides: 78 mg/dL (ref 0–149)
VLDL Cholesterol Cal: 14 mg/dL (ref 5–40)

## 2023-03-27 LAB — CBC
Hematocrit: 34 % — ABNORMAL LOW (ref 37.5–51.0)
Hemoglobin: 12.6 g/dL — ABNORMAL LOW (ref 13.0–17.7)
MCH: 32.7 pg (ref 26.6–33.0)
MCHC: 37.1 g/dL — ABNORMAL HIGH (ref 31.5–35.7)
MCV: 88 fL (ref 79–97)
Platelets: 252 10*3/uL (ref 150–450)
RBC: 3.85 x10E6/uL — ABNORMAL LOW (ref 4.14–5.80)
RDW: 14 % (ref 11.6–15.4)
WBC: 4.4 10*3/uL (ref 3.4–10.8)

## 2023-03-27 LAB — HEMOGLOBIN A1C
Est. average glucose Bld gHb Est-mCnc: 114 mg/dL
Hgb A1c MFr Bld: 5.6 % (ref 4.8–5.6)

## 2023-03-27 NOTE — Progress Notes (Signed)
Internal Medicine Clinic Attending  Case discussed with the resident at the time of the visit.  We reviewed the resident's history and exam and pertinent patient test results.  I agree with the assessment, diagnosis, and plan of care documented in the resident's note.  

## 2023-03-27 NOTE — Addendum Note (Signed)
Addended by: Dickie La on: 03/27/2023 09:53 AM   Modules accepted: Level of Service

## 2023-04-06 ENCOUNTER — Encounter: Payer: Self-pay | Admitting: Student

## 2023-04-06 DIAGNOSIS — I10 Essential (primary) hypertension: Secondary | ICD-10-CM

## 2023-04-06 DIAGNOSIS — F1090 Alcohol use, unspecified, uncomplicated: Secondary | ICD-10-CM

## 2023-04-06 MED ORDER — HYDROCHLOROTHIAZIDE 12.5 MG PO TABS
12.5000 mg | ORAL_TABLET | Freq: Every day | ORAL | 11 refills | Status: DC
  Filled 2023-04-06: qty 30, 30d supply, fill #0

## 2023-04-06 MED ORDER — NALTREXONE HCL 50 MG PO TABS
50.0000 mg | ORAL_TABLET | Freq: Every day | ORAL | 11 refills | Status: DC
  Filled 2023-04-06: qty 30, 30d supply, fill #0

## 2023-04-06 NOTE — Addendum Note (Signed)
Addended by: Morene Crocker on: 04/06/2023 10:28 PM   Modules accepted: Orders

## 2023-04-07 ENCOUNTER — Other Ambulatory Visit (HOSPITAL_COMMUNITY): Payer: Self-pay

## 2023-04-17 ENCOUNTER — Other Ambulatory Visit (HOSPITAL_COMMUNITY): Payer: Self-pay

## 2023-04-22 ENCOUNTER — Ambulatory Visit (INDEPENDENT_AMBULATORY_CARE_PROVIDER_SITE_OTHER): Payer: 59 | Admitting: Student

## 2023-04-22 ENCOUNTER — Other Ambulatory Visit (HOSPITAL_COMMUNITY): Payer: Self-pay

## 2023-04-22 VITALS — BP 167/114 | HR 70 | Temp 97.7°F | Ht 72.0 in | Wt 202.0 lb

## 2023-04-22 DIAGNOSIS — E785 Hyperlipidemia, unspecified: Secondary | ICD-10-CM

## 2023-04-22 DIAGNOSIS — F1090 Alcohol use, unspecified, uncomplicated: Secondary | ICD-10-CM

## 2023-04-22 DIAGNOSIS — R7303 Prediabetes: Secondary | ICD-10-CM | POA: Diagnosis not present

## 2023-04-22 DIAGNOSIS — I10 Essential (primary) hypertension: Secondary | ICD-10-CM

## 2023-04-22 MED ORDER — AMLODIPINE BESYLATE 10 MG PO TABS
10.0000 mg | ORAL_TABLET | Freq: Every day | ORAL | 3 refills | Status: DC
  Filled 2023-04-22: qty 90, 90d supply, fill #0
  Filled 2023-07-21 – 2023-10-15 (×3): qty 90, 90d supply, fill #1

## 2023-04-22 MED ORDER — OLMESARTAN MEDOXOMIL 20 MG PO TABS
20.0000 mg | ORAL_TABLET | Freq: Every day | ORAL | 2 refills | Status: DC
  Filled 2023-04-22: qty 90, 90d supply, fill #0

## 2023-04-22 MED ORDER — NALTREXONE HCL 50 MG PO TABS
50.0000 mg | ORAL_TABLET | Freq: Every day | ORAL | 11 refills | Status: DC
  Filled 2023-04-22: qty 30, 30d supply, fill #0

## 2023-04-22 NOTE — Progress Notes (Signed)
 Established Patient Office Visit  Subjective   Patient ID: Tyler Burch, male    DOB: August 09, 1960  Age: 63 y.o. MRN: [id number]  Chief Complaint  Patient presents with   Follow-up    4 wk follow up  HTN-requests 90 day supply of olmesartan  (has been out for 2 days) Alcohol use-has been cutting back but now doesn't fall asleep as well       HPI This is a 63 year old male living with a history stated below and presents today for HTN. Please see problem based assessment and plan for additional details.    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   Osteoarthritis 02/01/2019   Erectile dysfunction 10/22/2016   Pre-diabetes 10/22/2016   Hyperlipidemia 10/22/2015   Gout 12/06/2012   Health care maintenance 12/06/2012   Essential hypertension 06/09/2008   Past Medical History:  Diagnosis Date   Foot pain, bilateral 07/19/2020   Gout    Hyperlipidemia    Hypertension    Past Surgical History:  Procedure Laterality Date   CARDIAC CATHETERIZATION N/A 08/27/2015   Procedure: Left Heart Cath and Coronary Angiography;  Surgeon: Peter M Jordan, MD;  Location: [facility] INVASIVE CV LAB;  Service: Cardiovascular;  Laterality: N/A;   Social History   Tobacco Use   Smoking status: Never   Smokeless tobacco: Never  Substance Use Topics   Alcohol use: Yes    Alcohol/week: 0.0 standard drinks of alcohol    Comment: Sometimes.   Drug use: No   Social History   Socioeconomic History   Marital status: Married    Spouse name: Not on file   Number of children: Not on file   Years of education: Not on file   Highest education level: Not on file  Occupational History   Occupation: Forklift  Tobacco Use   Smoking status: Never   Smokeless tobacco: Never  Substance and Sexual Activity   Alcohol use: Yes    Alcohol/week: 0.0  standard drinks of alcohol    Comment: Sometimes.   Drug use: No   Sexual activity: Not on file  Other Topics Concern   Not on file  Social History Narrative   Not on file   Social Drivers of Health   Financial Resource Strain: Not on file  Food Insecurity: No Food Insecurity (03/25/2023)   Hunger Vital Sign    Worried About Running Out of Food in the Last Year: Never true    Ran Out of Food in the Last Year: Never true  Transportation Needs: No Transportation Needs (03/25/2023)   PRAPARE - Administrator, Civil Service (Medical): No    Lack of Transportation (Non-Medical): No  Physical Activity: Not on file  Stress: Not on file  Social Connections: Not on file  Intimate Partner Violence: Not on file   Family Status  Relation Name Status   Sister  (Not Specified)  No partnership data on file   Family History  Problem Relation Age of Onset   Colon cancer Sister    Allergies  Allergen Reactions   Shellfish Allergy Other (See Comments)    Causes Gout to flareup   ROS   ROS negative except for what is noted on the assessment and plan.  Objective:     BP (!) 167/114 (BP Location: Right Arm, Cuff  Size: Normal) Comment: missing blood pressure med  Pulse 70   Temp 97.7 F (36.5 C) (Oral)   Ht 6' (1.829 m)   Wt 202 lb (91.6 kg)   SpO2 98%   BMI 27.40 kg/m  BP Readings from Last 3 Encounters:  04/22/23 (!) 167/114  03/25/23 (!) 149/91  07/06/22 (!) 156/95   Wt Readings from Last 3 Encounters:  04/22/23 202 lb (91.6 kg)  03/25/23 194 lb 9.6 oz (88.3 kg)  07/06/22 222 lb (100.7 kg)   SpO2 Readings from Last 3 Encounters:  04/22/23 98%  03/25/23 98%  07/06/22 99%      Physical Exam  General: Sitting in chair, no acute distress Cardiovascular: Regular rate, no murmurs/rubs/gallops.  No lower extremity edema bilaterally. Pulmonary: CTA bilaterally, no wheezing or crackles. Abdomen: Soft, nontender, nondistended, bowel sounds present. MSK: Range  of motion intact.  No results found for any visits on 04/22/23.  Last CBC Lab Results  Component Value Date   WBC 4.4 03/25/2023   HGB 12.6 (L) 03/25/2023   HCT 34.0 (L) 03/25/2023   MCV 88 03/25/2023   MCH 32.7 03/25/2023   RDW 14.0 03/25/2023   PLT 252 03/25/2023   Last metabolic panel Lab Results  Component Value Date   GLUCOSE 83 03/25/2023   NA 138 03/25/2023   K 5.2 03/25/2023   CL 103 03/25/2023   CO2 21 03/25/2023   BUN 34 (H) 03/25/2023   CREATININE 1.37 (H) 03/25/2023   EGFR 59 (L) 03/25/2023   CALCIUM  9.5 03/25/2023   PROT 7.3 03/25/2023   ALBUMIN 4.3 03/25/2023   LABGLOB 3.0 03/25/2023   BILITOT 0.5 03/25/2023   ALKPHOS 77 03/25/2023   AST 34 03/25/2023   ALT 28 03/25/2023   ANIONGAP 10 08/28/2015   Last lipids Lab Results  Component Value Date   CHOL 173 03/25/2023   HDL 87 03/25/2023   LDLCALC 72 03/25/2023   TRIG 78 03/25/2023   CHOLHDL 2.0 03/25/2023   Last hemoglobin A1c Lab Results  Component Value Date   HGBA1C 5.6 03/25/2023   Last thyroid functions Lab Results  Component Value Date   TSH 0.935 06/09/2008    The 10-year ASCVD risk score (Arnett DK, et al., 2019) is: 17.6%    Assessment & Plan:  Patient is discussed with Dr. Forest Problem List Items Addressed This Visit       Cardiovascular and Mediastinum   Essential hypertension   BP Readings from Last 3 Encounters:  04/22/23 (!) 167/114  03/25/23 (!) 149/91  07/06/22 (!) 156/95   Patient's current medication regimen includes amlodipine  5 mg, olmesartan  20 mg daily.  Patient denies any symptoms of headaches, blurry vision, chest pain, shortness of breath, lower extremity swelling.  Patient blood pressure is elevated during this office visit, 167/114.  Patient was advised to increase amlodipine  from 5 mg to 10 mg and return back in 1 month for reevaluation of blood pressure.  Plan: -Continue amlodipine  from 5 mg to 10 mg daily -Return in 1 month for evaluation of blood  pressure, if it is still elevated we will plan increasing olmesartan  to 40 mg.      Relevant Medications   amLODipine  (NORVASC ) 10 MG tablet   olmesartan  (BENICAR ) 20 MG tablet     Other   Alcohol use disorder (Chronic)   Lab Results  Component Value Date   ALT 28 03/25/2023   AST 34 03/25/2023   ALKPHOS 77 03/25/2023   BILITOT 0.5 03/25/2023  Patient  reports that he is currently drinking 2 beers daily. Fib 4 score: 1.53, further investigation is needed. He has not picked up the Naltrexone  from the pharmacy per the last OV. Alcohol cessation counseling is provided.  Plan: -Naltrexone  50 mg daily        Relevant Medications   naltrexone  (DEPADE) 50 MG tablet   Hyperlipidemia   Lab Results  Component Value Date   CHOL 173 03/25/2023   HDL 87 03/25/2023   LDLCALC 72 03/25/2023   TRIG 78 03/25/2023   CHOLHDL 2.0 03/25/2023   Patient is currently taking atorvastatin  20 mg daily for primary prevention.  LDL 72.  -Continue atorvastatin  20 mg daily      Relevant Medications   amLODipine  (NORVASC ) 10 MG tablet   olmesartan  (BENICAR ) 20 MG tablet   Pre-diabetes - Primary   Lab Results  Component Value Date   HGBA1C 5.6 03/25/2023   Patient is currently not on any medication. Life style and Diet modification.       Return in about 1 month (around 05/23/2023) for HTN .    Toma Edwards, DO

## 2023-04-22 NOTE — Patient Instructions (Addendum)
 Thank you, [name] for allowing us  to provide your care today. Today we discussed:  For your blood pressure -Take amlodipine  10 mg, 1 tablet by mouth daily -Take olmesartan  20 mg, 1 tablet by mouth daily  For your high cholesterol -Take atorvastatin  20 mg daily  To help with quitting alcohol -Take naltrexone  50 mg, 1 tablet by mouth daily   I have ordered the following labs for you:  Lab Orders  No laboratory test(s) ordered today     Tests ordered today:  None   Referrals ordered today:   Referral Orders  No referral(s) requested today     I have ordered the following medication/changed the following medications:   Stop the following medications: Medications Discontinued During This Encounter  Medication Reason   amLODipine  (NORVASC ) 5 MG tablet    olmesartan  (BENICAR ) 20 MG tablet    colchicine  0.6 MG tablet    sildenafil  (VIAGRA ) 50 MG tablet    naltrexone  (DEPADE) 50 MG tablet Reorder     Start the following medications: Meds ordered this encounter  Medications   amLODipine  (NORVASC ) 10 MG tablet    Sig: Take 1 tablet (10 mg total) by mouth daily. For high blood pressure    Dispense:  90 tablet    Refill:  3   naltrexone  (DEPADE) 50 MG tablet    Sig: Take 1 tablet (50 mg total) by mouth daily. To help stop alcohol    Dispense:  30 tablet    Refill:  11   olmesartan  (BENICAR ) 20 MG tablet    Sig: Take 1 tablet (20 mg total) by mouth daily. For high blood pressure    Dispense:  90 tablet    Refill:  2     Follow up:  1 month HTN      Remember:   Should you have any questions or concerns please call the internal medicine clinic at [phone].     Tyler Edwards, DO [facility]

## 2023-04-24 NOTE — Assessment & Plan Note (Addendum)
 Lab Results  Component Value Date   HGBA1C 5.6 03/25/2023   Patient is currently not on any medication. Life style and Diet modification.

## 2023-04-24 NOTE — Assessment & Plan Note (Signed)
 Lab Results  Component Value Date   CHOL 173 03/25/2023   HDL 87 03/25/2023   LDLCALC 72 03/25/2023   TRIG 78 03/25/2023   CHOLHDL 2.0 03/25/2023   Patient is currently taking atorvastatin  20 mg daily for primary prevention.  LDL 72.  -Continue atorvastatin  20 mg daily

## 2023-04-24 NOTE — Assessment & Plan Note (Signed)
 Lab Results  Component Value Date   ALT 28 03/25/2023   AST 34 03/25/2023   ALKPHOS 77 03/25/2023   BILITOT 0.5 03/25/2023  Patient reports that he is currently drinking 2 beers daily. Fib 4 score: 1.53, further investigation is needed. He has not picked up the Naltrexone  from the pharmacy per the last OV. Alcohol cessation counseling is provided.  Plan: -Naltrexone  50 mg daily

## 2023-04-24 NOTE — Assessment & Plan Note (Signed)
 BP Readings from Last 3 Encounters:  04/22/23 (!) 167/114  03/25/23 (!) 149/91  07/06/22 (!) 156/95   Patient's current medication regimen includes amlodipine  5 mg, olmesartan  20 mg daily.  Patient denies any symptoms of headaches, blurry vision, chest pain, shortness of breath, lower extremity swelling.  Patient blood pressure is elevated during this office visit, 167/114.  Patient was advised to increase amlodipine  from 5 mg to 10 mg and return back in 1 month for reevaluation of blood pressure.  Plan: -Continue amlodipine  from 5 mg to 10 mg daily -Return in 1 month for evaluation of blood pressure, if it is still elevated we will plan increasing olmesartan  to 40 mg.

## 2023-04-28 NOTE — Progress Notes (Signed)
 Internal Medicine Clinic Attending  Case discussed with the resident at the time of the visit.  We reviewed the resident's history and exam and pertinent patient test results.  I agree with the assessment, diagnosis, and plan of care documented in the resident's note.

## 2023-05-05 ENCOUNTER — Ambulatory Visit (HOSPITAL_COMMUNITY): Admission: RE | Admit: 2023-05-05 | Payer: 59 | Source: Ambulatory Visit

## 2023-05-26 ENCOUNTER — Encounter: Payer: 59 | Admitting: Student

## 2023-06-02 ENCOUNTER — Other Ambulatory Visit: Payer: Self-pay

## 2023-06-05 ENCOUNTER — Other Ambulatory Visit (HOSPITAL_COMMUNITY): Payer: Self-pay

## 2023-06-05 ENCOUNTER — Ambulatory Visit: Payer: 59 | Admitting: Student

## 2023-06-05 ENCOUNTER — Encounter: Payer: Self-pay | Admitting: Student

## 2023-06-05 VITALS — BP 134/93 | HR 83 | Temp 97.7°F | Wt 200.0 lb

## 2023-06-05 DIAGNOSIS — N182 Chronic kidney disease, stage 2 (mild): Secondary | ICD-10-CM | POA: Diagnosis not present

## 2023-06-05 DIAGNOSIS — I129 Hypertensive chronic kidney disease with stage 1 through stage 4 chronic kidney disease, or unspecified chronic kidney disease: Secondary | ICD-10-CM

## 2023-06-05 DIAGNOSIS — E785 Hyperlipidemia, unspecified: Secondary | ICD-10-CM

## 2023-06-05 DIAGNOSIS — I10 Essential (primary) hypertension: Secondary | ICD-10-CM

## 2023-06-05 MED ORDER — OLMESARTAN MEDOXOMIL 40 MG PO TABS
40.0000 mg | ORAL_TABLET | Freq: Every day | ORAL | 2 refills | Status: DC
  Filled 2023-06-05: qty 90, 45d supply, fill #0
  Filled 2023-07-03: qty 90, 90d supply, fill #0
  Filled 2023-10-15: qty 90, 90d supply, fill #1

## 2023-06-05 NOTE — Assessment & Plan Note (Signed)
History of chronic kidney disease stage II.  Denies any urinary or bladder symptoms at this time.  Reports he is making urine as usual.  Patient is counseled on effect of blood pressure control on the kidneys.  Patient promises to adhere to his antihypertensive regimen to decrease progression of his chronic kidney disease.

## 2023-06-05 NOTE — Progress Notes (Signed)
CC: BP follow up   HPI:  [name] is a 63 y.o. male living with a history stated below and presents today for blood pressure follow-up.  Patient's amlodipine was increased from 5-10 during his last visit.  He denies any lower extremity edema at this time and endorsed complete adherence to his antihypertensive regimen. Please see problem based assessment and plan for additional details.  Past Medical History:  Diagnosis Date   Foot pain, bilateral 07/19/2020   Gout    Hyperlipidemia    Hypertension     Current Outpatient Medications on File Prior to Visit  Medication Sig Dispense Refill   allopurinol (ZYLOPRIM) 100 MG tablet Take 2 tablets (200 mg total) by mouth daily. 60 tablet 2   amLODipine (NORVASC) 10 MG tablet Take 1 tablet (10 mg total) by mouth daily. For high blood pressure 90 tablet 3   atorvastatin (LIPITOR) 20 MG tablet Take 1 tablet (20 mg total) by mouth daily. 90 tablet 3   naltrexone (DEPADE) 50 MG tablet Take 1 tablet (50 mg total) by mouth daily. To help stop alcohol 30 tablet 11   Sod Picosulfate-Mag Ox-Cit Acd (CLENPIQ) 10-3.5-12 MG-GM -GM/175ML SOLN Use as directed per office instructions and not the instructions on the packaging. Do not refrigerate. 350 mL 0   triamcinolone ointment (KENALOG) 0.1 % Apply topically 2 (two) times daily. 454 g 0   white petrolatum (VASELINE) OINT Apply 1 application topically as needed for lip care. 1000 g 0   No current facility-administered medications on file prior to visit.    Family History  Problem Relation Age of Onset   Colon cancer Sister     Social History   Socioeconomic History   Marital status: Married    Spouse name: Not on file   Number of children: Not on file   Years of education: Not on file   Highest education level: Not on file  Occupational History   Occupation: Forklift  Tobacco Use   Smoking status: Never   Smokeless tobacco: Never  Substance and Sexual Activity   Alcohol use: Yes     Alcohol/week: 0.0 standard drinks of alcohol    Comment: Sometimes.   Drug use: No   Sexual activity: Not on file  Other Topics Concern   Not on file  Social History Narrative   Not on file   Social Drivers of Health   Financial Resource Strain: Not on file  Food Insecurity: No Food Insecurity (03/25/2023)   Hunger Vital Sign    Worried About Running Out of Food in the Last Year: Never true    Ran Out of Food in the Last Year: Never true  Transportation Needs: No Transportation Needs (03/25/2023)   PRAPARE - Administrator, Civil Service (Medical): No    Lack of Transportation (Non-Medical): No  Physical Activity: Not on file  Stress: Not on file  Social Connections: Not on file  Intimate Partner Violence: Not on file    Review of Systems: ROS negative except for what is noted on the assessment and plan.  Vitals:   06/05/23 0846 06/05/23 0906  BP: (!) 141/94 (!) 134/93  Pulse: 85 83  Temp: 97.7 F (36.5 C)   TempSrc: Oral   SpO2: 100%   Weight: 200 lb (90.7 kg)     Physical Exam: Constitutional: well-appearing man, sitting in chair, in no acute distress Cardiovascular: regular rate and rhythm, no m/r/g Pulmonary/Chest: normal work of breathing on room air,  lungs clear to auscultation bilaterally Abdominal: soft, non-tender, non-distended MSK: No lower extremity edema Neurological: alert & oriented x 3, no focal deficit Skin: warm and dry Psych: normal mood and behavior  Assessment & Plan:   Hyperlipidemia Lipid profile 2 months ago at goal.  Continue Lipitor 20 mg daily.  Consider rechecking lipid profile in 2 to 4 months.  Essential hypertension History of hypertension.  Currently being managed with amlodipine 10 mg and olmesartan 20 mg.  Reports complete adherence to these medications.  Blood pressure in the office today is 134/93 which is above goal.  Will increase olmesartan to 40 mg and have the patient come back in a month for nurse only visit  to recheck his blood pressure.  If blood pressure continues to be above goal during that time please consider adding a third agent and recheck BMP.  Denies any lower extremity edema. -Increase olmesartan to 40 mg -Continue amlodipine 10 mg daily  CKD (chronic kidney disease), stage II History of chronic kidney disease stage II.  Denies any urinary or bladder symptoms at this time.  Reports he is making urine as usual.  Patient is counseled on effect of blood pressure control on the kidneys.  Patient promises to adhere to his antihypertensive regimen to decrease progression of his chronic kidney disease.   Patient discussed with Dr. Lavonna Monarch, M.D [facility] Phone: [phone] Date 06/05/2023 Time 9:34 AM

## 2023-06-05 NOTE — Assessment & Plan Note (Signed)
Lipid profile 2 months ago at goal.  Continue Lipitor 20 mg daily.  Consider rechecking lipid profile in 2 to 4 months.

## 2023-06-05 NOTE — Assessment & Plan Note (Addendum)
History of hypertension.  Currently being managed with amlodipine 10 mg and olmesartan 20 mg.  Reports complete adherence to these medications.  Blood pressure in the office today is 134/93 which is above goal.  Will increase olmesartan to 40 mg and have the patient come back in a month for nurse only visit to recheck his blood pressure.  If blood pressure continues to be above goal during that time please consider adding a third agent and recheck BMP.  Denies any lower extremity edema. -Increase olmesartan to 40 mg -Continue amlodipine 10 mg daily

## 2023-06-05 NOTE — Patient Instructions (Addendum)
Thank you, Tyler Burch for allowing [place] to provide your care today. Today we discussed your blood pressure.  Your blood pressure in office today was 134/93 which is still above where I would want it to be.  I am increasing the olmesartan from 20 mg to 40 mg.  Continue taking 2 pills of the olmesartan you have until you run out.  Sent the 40 mg pills to your pharmacy that can be picked up after March 1.  When you do come back to the clinic for nurse only visit to have your blood pressure rechecked.  I have ordered the following labs for you:  Lab Orders  No laboratory test(s) ordered today     Tests ordered today:    Referrals ordered today:   Referral Orders  No referral(s) requested today     I have ordered the following medication/changed the following medications:   Stop the following medications: Medications Discontinued During This Encounter  Medication Reason   olmesartan (BENICAR) 20 MG tablet Reorder     Start the following medications: Meds ordered this encounter  Medications   olmesartan (BENICAR) 20 MG tablet    Sig: Take 2 tablets (40 mg total) by mouth daily. For high blood pressure    Dispense:  90 tablet    Refill:  2    Fill after 06/13/2023     Follow up:  1 month for nurse only visit    Remember:   Should you have any questions or concerns please call the internal medicine clinic at [phone].    Kathleen Lime, M.D [facility]

## 2023-06-08 NOTE — Progress Notes (Signed)
 Internal Medicine Clinic Attending  Case discussed with the resident at the time of the visit.  We reviewed the resident's history and exam and pertinent patient test results.  I agree with the assessment, diagnosis, and plan of care documented in the resident's note.

## 2023-07-03 ENCOUNTER — Ambulatory Visit: Payer: 59 | Admitting: *Deleted

## 2023-07-03 ENCOUNTER — Other Ambulatory Visit: Payer: Self-pay | Admitting: Student

## 2023-07-03 ENCOUNTER — Other Ambulatory Visit (HOSPITAL_COMMUNITY): Payer: Self-pay

## 2023-07-03 DIAGNOSIS — E785 Hyperlipidemia, unspecified: Secondary | ICD-10-CM

## 2023-07-03 MED ORDER — ATORVASTATIN CALCIUM 40 MG PO TABS
40.0000 mg | ORAL_TABLET | Freq: Every day | ORAL | 3 refills | Status: DC
  Filled 2023-07-03 (×2): qty 90, 90d supply, fill #0

## 2023-07-03 NOTE — Progress Notes (Signed)
    Tyler Burch presented today for blood pressure check. Patient is prescribed blood pressure medications and I confirmed that patient did take their blood pressure medication prior to today's appointment. Blood pressure was taken in the usual and appropriate manner using an automated BP cuff.     Vitals:   07/03/23 0947  BP: 133/85      Results of today's visit will be routed to Dr. Mickie Bail for review and further management.

## 2023-07-15 ENCOUNTER — Other Ambulatory Visit (HOSPITAL_COMMUNITY): Payer: Self-pay

## 2023-07-16 ENCOUNTER — Other Ambulatory Visit: Payer: Self-pay

## 2023-07-21 ENCOUNTER — Other Ambulatory Visit (HOSPITAL_COMMUNITY): Payer: Self-pay

## 2023-07-22 ENCOUNTER — Encounter: Payer: Self-pay | Admitting: *Deleted

## 2023-07-22 ENCOUNTER — Ambulatory Visit: Admitting: *Deleted

## 2023-07-22 NOTE — Progress Notes (Signed)
    Tyler Burch presented today for blood pressure check. Patient is prescribed blood pressure medications and I confirmed that patient did take their blood pressure medication prior to today's appointment. Blood pressure was taken in the usual and appropriate manner using an automated BP cuff.     Vitals:   07/22/23 0937  BP: 130/84      Results of today's visit will be routed to   The Red Team  for review and further management.

## 2023-07-31 ENCOUNTER — Other Ambulatory Visit (HOSPITAL_COMMUNITY): Payer: Self-pay

## 2023-08-14 ENCOUNTER — Other Ambulatory Visit (HOSPITAL_COMMUNITY): Payer: Self-pay

## 2023-08-26 ENCOUNTER — Other Ambulatory Visit (HOSPITAL_COMMUNITY): Payer: Self-pay

## 2023-10-15 ENCOUNTER — Other Ambulatory Visit (HOSPITAL_COMMUNITY): Payer: Self-pay

## 2023-11-30 ENCOUNTER — Ambulatory Visit

## 2023-11-30 ENCOUNTER — Other Ambulatory Visit (HOSPITAL_COMMUNITY): Payer: Self-pay

## 2023-11-30 VITALS — BP 127/86 | HR 80 | Temp 97.8°F | Wt 200.6 lb

## 2023-11-30 DIAGNOSIS — I10 Essential (primary) hypertension: Secondary | ICD-10-CM | POA: Diagnosis not present

## 2023-11-30 DIAGNOSIS — R7303 Prediabetes: Secondary | ICD-10-CM | POA: Diagnosis not present

## 2023-11-30 DIAGNOSIS — E785 Hyperlipidemia, unspecified: Secondary | ICD-10-CM | POA: Diagnosis not present

## 2023-11-30 DIAGNOSIS — M1000 Idiopathic gout, unspecified site: Secondary | ICD-10-CM

## 2023-11-30 DIAGNOSIS — M10072 Idiopathic gout, left ankle and foot: Secondary | ICD-10-CM

## 2023-11-30 MED ORDER — AMLODIPINE BESYLATE 10 MG PO TABS
10.0000 mg | ORAL_TABLET | Freq: Every day | ORAL | 3 refills | Status: AC
  Filled 2023-11-30: qty 90, 90d supply, fill #0
  Filled 2024-01-27: qty 30, 30d supply, fill #0
  Filled 2024-03-09: qty 30, 30d supply, fill #1
  Filled 2024-04-08: qty 30, 30d supply, fill #2
  Filled 2024-05-16: qty 30, 30d supply, fill #3

## 2023-11-30 MED ORDER — ATORVASTATIN CALCIUM 40 MG PO TABS
40.0000 mg | ORAL_TABLET | Freq: Every day | ORAL | 3 refills | Status: DC
  Filled 2023-11-30: qty 90, 90d supply, fill #0

## 2023-11-30 MED ORDER — OLMESARTAN MEDOXOMIL 40 MG PO TABS
40.0000 mg | ORAL_TABLET | Freq: Every day | ORAL | 2 refills | Status: AC
  Filled 2023-11-30: qty 90, 90d supply, fill #0
  Filled 2024-01-27: qty 30, 30d supply, fill #0
  Filled 2024-03-09: qty 30, 30d supply, fill #1
  Filled 2024-04-08: qty 30, 30d supply, fill #2
  Filled 2024-05-16: qty 30, 30d supply, fill #3

## 2023-11-30 MED ORDER — COLCHICINE 0.6 MG PO TABS
0.6000 mg | ORAL_TABLET | Freq: Every day | ORAL | 0 refills | Status: AC
  Filled 2023-11-30: qty 10, 10d supply, fill #0

## 2023-11-30 MED ORDER — ALLOPURINOL 100 MG PO TABS
200.0000 mg | ORAL_TABLET | Freq: Every day | ORAL | 2 refills | Status: AC
  Filled 2023-11-30: qty 60, 30d supply, fill #0

## 2023-11-30 NOTE — Assessment & Plan Note (Signed)
 Patient not sure how often he is taking atorvastatin .  Last LDL 72 in December 2024.  He has no symptoms of myopathy at this time.  - Continue atorvastatin  40 mg, refill provided -Lipid profile today

## 2023-11-30 NOTE — Progress Notes (Signed)
Internal Medicine Clinic Attending  I was physically present during the key portions of the resident provided service and participated in the medical decision making of patient's management care. I reviewed pertinent patient test results.  The assessment, diagnosis, and plan were formulated together and I agree with the documentation in the resident's note.  Williams, Julie Anne, MD  

## 2023-11-30 NOTE — Assessment & Plan Note (Signed)
 Acute concern today of left foot pain.  This pain started overnight 2 nights ago without inciting incident or injury.  He states that he has been taking allopurinol  about 3 times per week.  I do not see any recent dispenses for this medication.  His pain has improved somewhat over 2 days while he has been taking naproxen .  He had been drinking a little more than normal at home.  On exam, he has some tenderness, erythema, and warmth over the base of the fifth MTP joint.  He has taken colchicine  in the past for previous gout flare. We will prescribe a short course of this.  -Pause Allopurinol  until symptoms resolve given questionable adherence. Refill sent to pharmacy. Patient instructed not to take allopurinol  until symptoms resolve, but can start 200mg  once daily after that.  He was educated about the importance of daily adherence to this medication. -Prescribed 0.6mg  Colchicine  once daily until symptoms resolve

## 2023-11-30 NOTE — Progress Notes (Signed)
 CC: Routine Follow Up for blood pressure after last office visit February 2025.  HPI:  Tyler Burch is a 63 y.o. male with pertinent PMH of hypertension, hyperlipidemia, and gout who presents for follow-up for chronic condition management and an acute concern of left foot pain. Please see problem based assessment and plan for further history.   Review of Systems  Constitutional:  Negative for chills and fever.  Eyes:  Negative for blurred vision.  Respiratory:  Negative for shortness of breath.   Cardiovascular:  Negative for chest pain.    Medications: Current Outpatient Medications  Medication Instructions   allopurinol  (ZYLOPRIM ) 200 mg, Oral, Daily   amLODipine  (NORVASC ) 10 mg, Oral, Daily, For high blood pressure   atorvastatin  (LIPITOR ) 40 mg, Oral, Daily   colchicine  0.6 mg, Oral, Daily   naltrexone  (DEPADE) 50 mg, Oral, Daily, To help stop alcohol   olmesartan  (BENICAR ) 40 mg, Oral, Daily, For high blood pressure   Sod Picosulfate-Mag Ox-Cit Acd (CLENPIQ ) 10-3.5-12 MG-GM -GM/175ML SOLN Use as directed per office instructions and not the instructions on the packaging. Do not refrigerate.   triamcinolone  ointment (KENALOG ) 0.1 % Topical, 2 times daily   white petrolatum  (VASELINE) OINT 1 application , Topical, As needed     Physical Exam:  Vitals:   11/30/23 0829  BP: 127/86  Pulse: 80  Temp: 97.8 F (36.6 C)  TempSrc: Oral  SpO2: 99%  Weight: 200 lb 9.6 oz (91 kg)    Physical Exam Constitutional:      General: He is not in acute distress.    Appearance: He is not ill-appearing.  Cardiovascular:     Rate and Rhythm: Normal rate and regular rhythm.  Pulmonary:     Effort: No respiratory distress.  Feet:     Comments: Erythema, warmth, and tenderness at the fifth MTP joint.  Skin wrinkling over the joint as well.  Effusion present. Neurological:     Mental Status: He is alert.       Assessment & Plan:   Assessment & Plan Essential hypertension He has  a history of hypertension. His current regimen is Amlodipine  10mg  and Olmesartan  40mg . He reports that he is mostly adherent to his medications.  He takes olmesartan  daily he takes amlodipine  most days.  He took both this morning.  He denies headaches, chest pain, shortness of breath, peripheral edema, and vision changes. His BP in the office today is 127/86.   - BMP to assess kidney function -Continue amlodipine  10 mg and Olmesartan  40 mg, refills provided Acute idiopathic gout of left foot Acute concern today of left foot pain.  This pain started overnight 2 nights ago without inciting incident or injury.  He states that he has been taking allopurinol  about 3 times per week.  I do not see any recent dispenses for this medication.  His pain has improved somewhat over 2 days while he has been taking naproxen .  He had been drinking a little more than normal at home.  On exam, he has some tenderness, erythema, and warmth over the base of the fifth MTP joint.  He has taken colchicine  in the past for previous gout flare. We will prescribe a short course of this.  -Pause Allopurinol  until symptoms resolve given questionable adherence. Refill sent to pharmacy. Patient instructed not to take allopurinol  until symptoms resolve, but can start 200mg  once daily after that.  He was educated about the importance of daily adherence to this medication. -Prescribed 0.6mg  Colchicine  once daily  until symptoms resolve Hyperlipidemia, unspecified hyperlipidemia type Patient not sure how often he is taking atorvastatin .  Last LDL 72 in December 2024.  He has no symptoms of myopathy at this time.  - Continue atorvastatin  40 mg, refill provided -Lipid profile today Pre-diabetes History of prediabetes, last A1c 5.6 in December of 2024.   -Repeat A1C today  Orders Placed This Encounter  Procedures   Basic metabolic panel with GFR   Lipid Profile   Hemoglobin A1c   Follow-up in 3 months for his chronic  conditions.  Patient seen with Dr. Mliss Trudy Melvenia Napoleon, MD [facility] Internal [facility] Resident PGY-1 Clinic Phone: [phone] Please contact the on call pager at [phone] for any urgent or emergent needs.

## 2023-11-30 NOTE — Assessment & Plan Note (Signed)
 He has a history of hypertension. His current regimen is Amlodipine  10mg  and Olmesartan  40mg . He reports that he is mostly adherent to his medications.  He takes olmesartan  daily he takes amlodipine  most days.  He took both this morning.  He denies headaches, chest pain, shortness of breath, peripheral edema, and vision changes. His BP in the office today is 127/86.   - BMP to assess kidney function -Continue amlodipine  10 mg and Olmesartan  40 mg, refills provided

## 2023-11-30 NOTE — Assessment & Plan Note (Signed)
 History of prediabetes, last A1c 5.6 in December of 2024.   -Repeat A1C today

## 2023-11-30 NOTE — Patient Instructions (Addendum)
 Thank you, Mr.Ronal Lyles, for allowing us  to provide your care today. Today we discussed . . .  > Gout       - The pain in your foot is most likely from gout.  I prescribed colchicine  0.6 mg.  Take 1 tablet/day for as long as your symptoms.  Wait to restart your allopurinol  until your symptoms have subsided. > High blood pressure       - Your blood pressure looks great today.  Keep taking amlodipine  10 mg every day and olmesartan  40 mg every day > High cholesterol       - We will check your cholesterol levels today and I will call you with the results.  Please keep taking your atorvastatin  40 mg once daily.   I have ordered the following labs for you:   Lab Orders         Basic metabolic panel with GFR         Lipid Profile         Hemoglobin A1c       Referrals ordered today:   Referral Orders  No referral(s) requested today      Follow up: 3 months    Remember:  Should you have any questions or concerns please call the internal medicine clinic at [phone].     Melvenia Morrison, [facility]

## 2023-12-01 LAB — LIPID PANEL
Chol/HDL Ratio: 2.7 ratio (ref 0.0–5.0)
Cholesterol, Total: 217 mg/dL — ABNORMAL HIGH (ref 100–199)
HDL: 81 mg/dL (ref >39)
LDL Chol Calc (NIH): 120 mg/dL — ABNORMAL HIGH (ref 0–99)
Triglycerides: 92 mg/dL (ref 0–149)
VLDL Cholesterol Cal: 16 mg/dL (ref 5–40)

## 2023-12-01 LAB — BASIC METABOLIC PANEL WITH GFR
BUN/Creatinine Ratio: 20 (ref 10–24)
BUN: 36 mg/dL — ABNORMAL HIGH (ref 8–27)
CO2: 18 mmol/L — ABNORMAL LOW (ref 20–29)
Calcium: 9.5 mg/dL (ref 8.6–10.2)
Chloride: 102 mmol/L (ref 96–106)
Creatinine, Ser: 1.84 mg/dL — ABNORMAL HIGH (ref 0.76–1.27)
Glucose: 97 mg/dL (ref 70–99)
Potassium: 5.5 mmol/L — ABNORMAL HIGH (ref 3.5–5.2)
Sodium: 137 mmol/L (ref 134–144)
eGFR: 41 mL/min/1.73 — ABNORMAL LOW (ref >59)

## 2023-12-01 LAB — HEMOGLOBIN A1C
Est. average glucose Bld gHb Est-mCnc: 117 mg/dL
Hgb A1c MFr Bld: 5.7 % — ABNORMAL HIGH (ref 4.8–5.6)

## 2023-12-02 ENCOUNTER — Telehealth: Payer: Self-pay | Admitting: *Deleted

## 2023-12-02 NOTE — Telephone Encounter (Signed)
 Patient states he was returning a call No message on chart Pt recently seen by Dr Napoleon Will have MD contact pt at [phone] with lab results-ok to lve message.

## 2023-12-03 ENCOUNTER — Ambulatory Visit: Payer: Self-pay

## 2023-12-03 DIAGNOSIS — I10 Essential (primary) hypertension: Secondary | ICD-10-CM

## 2023-12-03 NOTE — Progress Notes (Signed)
 Patient called and informed of worsening kidney function and hyperkalemia. It is possible that this may have precipitated his gout attack or may be expected result of his ARB, as he has not had a BMP since his dose of Olmesartan  was increased.  Patient advised to come back in 2 weeks for a lab only visit to establish whether his increased creatinine is his new baseline.  If creatinine persistently elevated, could consider reducing the dose of olmesartan   Patient informed of elevated LDL and prediabetes range J8r.  He had been taking his atorvastatin  only sometimes.  He was advised to take this every day.

## 2023-12-09 ENCOUNTER — Other Ambulatory Visit (HOSPITAL_COMMUNITY): Payer: Self-pay

## 2023-12-17 ENCOUNTER — Other Ambulatory Visit: Payer: Self-pay | Admitting: Internal Medicine

## 2023-12-17 DIAGNOSIS — N182 Chronic kidney disease, stage 2 (mild): Secondary | ICD-10-CM

## 2023-12-24 ENCOUNTER — Ambulatory Visit

## 2024-01-13 ENCOUNTER — Other Ambulatory Visit

## 2024-01-13 ENCOUNTER — Telehealth: Payer: Self-pay | Admitting: *Deleted

## 2024-01-13 DIAGNOSIS — N182 Chronic kidney disease, stage 2 (mild): Secondary | ICD-10-CM | POA: Diagnosis not present

## 2024-01-13 NOTE — Telephone Encounter (Signed)
 Patient states was never called with his results from his previous labs.  Got labs this morning.  Would like to get a call when those are available.

## 2024-01-14 LAB — BASIC METABOLIC PANEL WITH GFR
BUN/Creatinine Ratio: 20 (ref 10–24)
BUN: 27 mg/dL (ref 8–27)
CO2: 18 mmol/L — ABNORMAL LOW (ref 20–29)
Calcium: 9.5 mg/dL (ref 8.6–10.2)
Chloride: 108 mmol/L — ABNORMAL HIGH (ref 96–106)
Creatinine, Ser: 1.35 mg/dL — ABNORMAL HIGH (ref 0.76–1.27)
Glucose: 89 mg/dL (ref 70–99)
Potassium: 4.9 mmol/L (ref 3.5–5.2)
Sodium: 141 mmol/L (ref 134–144)
eGFR: 60 mL/min/1.73 (ref >59)

## 2024-01-27 ENCOUNTER — Other Ambulatory Visit (HOSPITAL_COMMUNITY): Payer: Self-pay

## 2024-02-05 ENCOUNTER — Other Ambulatory Visit (HOSPITAL_COMMUNITY): Payer: Self-pay

## 2024-03-01 ENCOUNTER — Ambulatory Visit: Admitting: Student

## 2024-03-09 ENCOUNTER — Other Ambulatory Visit (HOSPITAL_COMMUNITY): Payer: Self-pay

## 2024-03-14 ENCOUNTER — Ambulatory Visit: Admitting: Student

## 2024-03-21 ENCOUNTER — Ambulatory Visit: Payer: Self-pay | Admitting: Student

## 2024-03-25 ENCOUNTER — Ambulatory Visit: Payer: Self-pay | Admitting: Student

## 2024-04-08 ENCOUNTER — Other Ambulatory Visit (HOSPITAL_COMMUNITY): Payer: Self-pay

## 2024-04-27 ENCOUNTER — Ambulatory Visit: Payer: Self-pay

## 2024-04-27 ENCOUNTER — Other Ambulatory Visit (HOSPITAL_COMMUNITY): Payer: Self-pay

## 2024-04-27 VITALS — BP 138/84 | HR 85 | Temp 97.9°F | Ht 72.0 in | Wt 198.6 lb

## 2024-04-27 DIAGNOSIS — N182 Chronic kidney disease, stage 2 (mild): Secondary | ICD-10-CM | POA: Diagnosis not present

## 2024-04-27 DIAGNOSIS — R7303 Prediabetes: Secondary | ICD-10-CM

## 2024-04-27 DIAGNOSIS — N529 Male erectile dysfunction, unspecified: Secondary | ICD-10-CM | POA: Diagnosis not present

## 2024-04-27 DIAGNOSIS — E785 Hyperlipidemia, unspecified: Secondary | ICD-10-CM | POA: Diagnosis not present

## 2024-04-27 DIAGNOSIS — F1090 Alcohol use, unspecified, uncomplicated: Secondary | ICD-10-CM | POA: Diagnosis not present

## 2024-04-27 MED ORDER — NALTREXONE HCL 50 MG PO TABS
50.0000 mg | ORAL_TABLET | Freq: Every day | ORAL | 11 refills | Status: AC
  Filled 2024-04-27: qty 30, 30d supply, fill #0

## 2024-04-27 MED ORDER — ATORVASTATIN CALCIUM 40 MG PO TABS
40.0000 mg | ORAL_TABLET | Freq: Every day | ORAL | 3 refills | Status: AC
  Filled 2024-04-27: qty 90, 90d supply, fill #0

## 2024-04-27 MED ORDER — SILDENAFIL CITRATE 50 MG PO TABS
50.0000 mg | ORAL_TABLET | ORAL | 1 refills | Status: AC | PRN
  Filled 2024-04-27: qty 20, 20d supply, fill #0

## 2024-04-27 NOTE — Assessment & Plan Note (Addendum)
 Chronic.  Using labs from 03/2023, fib 4 was 1.56.  He is drinking about 2, 24 ounce beers every night.  He works 2 jobs and is having difficulties in his sexual life with relationship issues with his partner.  He is not currently using the naltrexone  but has some at home and wants to start using it again.  Discussed the toxic effects of alcohol on hormone levels, sexual performance and the cardiovascular system.  Given the interplay of interpersonal relationships and substance use disorder, recommended speaking with our behavioral health counselor here in the office, patient agreeable.  Last CMP was in 03/2023, LFTs normal at this time.  Patient would prefer to not get blood work today but he will come back to get blood work before his follow-up appointment in 3 months. Plan Reordered naltrexone  50 mg daily Referral for behavioral health CMP ordered for future before next appointment with Dr. Heather fib 4 score Orders:   naltrexone  (DEPADE) 50 MG tablet; Take 1 tablet (50 mg total) by mouth daily. To help stop alcohol   Ambulatory referral to [facility]

## 2024-04-27 NOTE — Assessment & Plan Note (Addendum)
 Last lipid panel in 11/2023.  Total cholesterol 217, LDL-C 120.  Patient is not taking atorvastatin .  Discussed the importance of taking this medication and how it relates to his cardiovascular health and therefore erectile dysfunction. Plan Atorvastatin  40 mg daily LDL goal less than 100 Orders:   atorvastatin  (LIPITOR ) 40 MG tablet; Take 1 tablet (40 mg total) by mouth daily.

## 2024-04-27 NOTE — Progress Notes (Signed)
 "  Established Patient Office Visit  Subjective   Patient ID: Tyler Burch, male    DOB: Aug 13, 1960  Age: 64 y.o. MRN: [id number]  HPI Patient presents for checkup, having issues with erectile dysfunction.  See below for past medical history and problem based assessment and plan. 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   Osteoarthritis 02/01/2019   Erectile dysfunction 10/22/2016   Pre-diabetes 10/22/2016   Hyperlipidemia 10/22/2015   Acute gout 12/06/2012   Health care maintenance 12/06/2012   Essential hypertension 06/09/2008   Past Medical History:  Diagnosis Date   Foot pain, bilateral 07/19/2020   Gout    Hyperlipidemia    Hypertension         Objective:     BP 138/84 (BP Location: Left Arm, Patient Position: Sitting, Cuff Size: Normal)   Pulse 85   Temp 97.9 F (36.6 C) (Oral)   Ht 6' (1.829 m)   Wt 198 lb 9.6 oz (90.1 kg)   SpO2 96%   BMI 26.94 kg/m  BP Readings from Last 3 Encounters:  04/27/24 138/84  11/30/23 127/86  07/22/23 130/84      Physical Exam Vitals reviewed.  Constitutional:      Appearance: Normal appearance.  HENT:     Nose: Nose normal.     Mouth/Throat:     Mouth: Mucous membranes are moist.     Pharynx: Oropharynx is clear.  Eyes:     Conjunctiva/sclera: Conjunctivae normal.  Cardiovascular:     Rate and Rhythm: Normal rate and regular rhythm.  Pulmonary:     Effort: Pulmonary effort is normal.     Breath sounds: Normal breath sounds.  Abdominal:     General: There is no distension.     Palpations: Abdomen is soft. There is no mass.     Tenderness: There is no abdominal tenderness.  Skin:    General: Skin is warm.  Neurological:     General: No focal deficit present.     Mental Status: He is alert and oriented to person, place, and time.  Psychiatric:        Mood and  Affect: Mood normal.        Behavior: Behavior normal.      No results found for any visits on 04/27/24.  Last metabolic panel Lab Results  Component Value Date   GLUCOSE 89 01/13/2024   NA 141 01/13/2024   K 4.9 01/13/2024   CL 108 (H) 01/13/2024   CO2 18 (L) 01/13/2024   BUN 27 01/13/2024   CREATININE 1.35 (H) 01/13/2024   EGFR 60 01/13/2024   CALCIUM  9.5 01/13/2024   PROT 7.3 03/25/2023   ALBUMIN 4.3 03/25/2023   LABGLOB 3.0 03/25/2023   BILITOT 0.5 03/25/2023   ALKPHOS 77 03/25/2023   AST 34 03/25/2023   ALT 28 03/25/2023   ANIONGAP 10 08/28/2015   Last lipids Lab Results  Component Value Date   CHOL 217 (H) 11/30/2023   HDL 81 11/30/2023   LDLCALC 120 (H) 11/30/2023   TRIG 92 11/30/2023   CHOLHDL 2.7 11/30/2023   Last hemoglobin A1c Lab Results  Component Value Date   HGBA1C 5.7 (H) 11/30/2023      The 10-year ASCVD risk score (Arnett DK, et al., 2019) is: 14.2%    Assessment & Plan:   Assessment &  Plan Vasculogenic erectile dysfunction, unspecified vasculogenic erectile dysfunction type Has a history of erectile dysfunction and has had improvement using sildenafil  in the past.  He feels like he is not able to get in the mood when his partner wants him to be.  He feels like this is partly because he is not aroused but also partly due to anxiety for not being able to have an erection.  He comments on how marriage can be challenging and how him and his wife have moments.  He queries whether or not his drinking plays a role in his sex life and erectile dysfunction.  We discussed how alcohol is a depressant and works on many different body systems, additionally it is toxic to the liver which regulates hormones and cholesterol.  We discussed that the most common cause of erectile dysfunction is cardiovascular disease and I stressed the importance of managing his diet, alcohol cessation and adhering to his atorvastatin .  He does not have morning erections.  Denies  discharge, ulcers or abnormal urine.  I suspect that his erectile dysfunction is multifactorial, with there being a psychiatric component as well as cardiovascular.  Counseled on adverse effects of sildenafil  such as lightheadedness and dizziness. Plan Ordered sildenafil  50 as needed Encouraged alcohol cessation Manage cardiovascular risk factors Orders:   sildenafil  (VIAGRA ) 50 MG tablet; Take 1 tablet (50 mg total) by mouth as needed for erectile dysfunction.  Alcohol use disorder Chronic.  Using labs from 03/2023, fib 4 was 1.56.  He is drinking about 2, 24 ounce beers every night.  He works 2 jobs and is having difficulties in his sexual life with relationship issues with his partner.  He is not currently using the naltrexone  but has some at home and wants to start using it again.  Discussed the toxic effects of alcohol on hormone levels, sexual performance and the cardiovascular system.  Given the interplay of interpersonal relationships and substance use disorder, recommended speaking with our behavioral health counselor here in the office, patient agreeable.  Last CMP was in 03/2023, LFTs normal at this time.  Patient would prefer to not get blood work today but he will come back to get blood work before his follow-up appointment in 3 months. Plan Reordered naltrexone  50 mg daily Referral for behavioral health CMP ordered for future before next appointment with Dr. Heather fib 4 score Orders:   naltrexone  (DEPADE) 50 MG tablet; Take 1 tablet (50 mg total) by mouth daily. To help stop alcohol   Ambulatory referral to Behavioral Health  Hyperlipidemia, unspecified hyperlipidemia type Last lipid panel in 11/2023.  Total cholesterol 217, LDL-C 120.  Patient is not taking atorvastatin .  Discussed the importance of taking this medication and how it relates to his cardiovascular health and therefore erectile dysfunction. Plan Atorvastatin  40 mg daily LDL goal less than 100 Orders:    atorvastatin  (LIPITOR ) 40 MG tablet; Take 1 tablet (40 mg total) by mouth daily.  CKD (chronic kidney disease), stage II BP 138/84 today.  Creatinine 1.35 in 01/2024.  Microalbumin/creatinine urine last checked in 03/2023 and was 49.  Patient is adherent to his blood pressure medicine amlodipine  10 and olmesartan  40 daily.  Willing to get a urinalysis today. Plan Continue BP, cholesterol and diabetes management Orders:   Comprehensive metabolic panel with GFR; Future   Microalbumin / Creatinine Urine Ratio  Pre-diabetes Last A1c in 11/2023 5.7.  Discussed the importance of eating healthy and exercising regularly. Plan Continue with lifestyle modification Repeat A1c in 11/2024  Return in about 3 months (around 07/26/2024) for ED, AUD and HLD.    Viktoria King, DO  "

## 2024-04-27 NOTE — Assessment & Plan Note (Addendum)
 BP 138/84 today.  Creatinine 1.35 in 01/2024.  Microalbumin/creatinine urine last checked in 03/2023 and was 49.  Patient is adherent to his blood pressure medicine amlodipine  10 and olmesartan  40 daily.  Willing to get a urinalysis today. Plan Continue BP, cholesterol and diabetes management Orders:   Comprehensive metabolic panel with GFR; Future   Microalbumin / Creatinine Urine Ratio

## 2024-04-27 NOTE — Assessment & Plan Note (Signed)
 Last A1c in 11/2023 5.7.  Discussed the importance of eating healthy and exercising regularly. Plan Continue with lifestyle modification Repeat A1c in 11/2024

## 2024-04-27 NOTE — Assessment & Plan Note (Addendum)
 Has a history of erectile dysfunction and has had improvement using sildenafil  in the past.  He feels like he is not able to get in the mood when his partner wants him to be.  He feels like this is partly because he is not aroused but also partly due to anxiety for not being able to have an erection.  He comments on how marriage can be challenging and how him and his wife have moments.  He queries whether or not his drinking plays a role in his sex life and erectile dysfunction.  We discussed how alcohol is a depressant and works on many different body systems, additionally it is toxic to the liver which regulates hormones and cholesterol.  We discussed that the most common cause of erectile dysfunction is cardiovascular disease and I stressed the importance of managing his diet, alcohol cessation and adhering to his atorvastatin .  He does not have morning erections.  Denies discharge, ulcers or abnormal urine.  I suspect that his erectile dysfunction is multifactorial, with there being a psychiatric component as well as cardiovascular.  Counseled on adverse effects of sildenafil  such as lightheadedness and dizziness. Plan Ordered sildenafil  50 as needed Encouraged alcohol cessation Manage cardiovascular risk factors Orders:   sildenafil  (VIAGRA ) 50 MG tablet; Take 1 tablet (50 mg total) by mouth as needed for erectile dysfunction.

## 2024-04-27 NOTE — Progress Notes (Signed)
 Internal Medicine Clinic Attending  Case discussed with the resident at the time of the visit.  We reviewed the resident's history and exam and pertinent patient test results.  I agree with the assessment, diagnosis, and plan of care documented in the resident's note.

## 2024-04-27 NOTE — Patient Instructions (Signed)
 It was wonderful seeing you today!   Please remember to...  1) Take your cholesterol medicine, called atorvastatin , daily. Take this in addition to your two blood pressure medicines  2) Try taking the naltrexone  again to see if it helps you cut back your drinking. Also, be on the look out for someone to call you to schedule an appointment with our Behavioral Health office. I think this will be a good resource to discuss alcohol cessation and how it relates to your sexual/marital health.   3) I placed an order for blood work to look at your liver and electrolytes. You can come to the office anytime to have these labs drawn or you can get the drawn when you come back for your Behavioral Health appointment.   -Please have these labs completed before you return for your follow up appointment in 3 months with the Doctor   And don't forget to get your flu shot!!  If you have any questions please feel free to the call the clinic at anytime at [phone].  Have a blessed day,  Dr. Charmayne

## 2024-04-28 ENCOUNTER — Ambulatory Visit: Payer: Self-pay

## 2024-04-28 LAB — MICROALBUMIN / CREATININE URINE RATIO
Creatinine, Urine: 83 mg/dL
Microalb/Creat Ratio: 70 mg/g{creat} — ABNORMAL HIGH (ref 0–29)
Microalbumin, Urine: 57.8 ug/mL

## 2024-05-10 ENCOUNTER — Other Ambulatory Visit (HOSPITAL_COMMUNITY): Payer: Self-pay

## 2024-05-16 ENCOUNTER — Encounter (HOSPITAL_COMMUNITY): Payer: Self-pay | Admitting: Licensed Clinical Social Worker

## 2024-05-16 ENCOUNTER — Other Ambulatory Visit (HOSPITAL_COMMUNITY): Payer: Self-pay

## 2024-05-16 ENCOUNTER — Telehealth (HOSPITAL_COMMUNITY): Payer: Self-pay | Admitting: Licensed Clinical Social Worker

## 2024-05-17 ENCOUNTER — Encounter (HOSPITAL_COMMUNITY): Payer: Self-pay | Admitting: Licensed Clinical Social Worker

## 2024-07-26 ENCOUNTER — Ambulatory Visit: Payer: Self-pay | Admitting: Student
